# Patient Record
Sex: Male | Born: 1985 | Race: White | Hispanic: No | Marital: Married | Smoking: Never smoker
Health system: Southern US, Community
[De-identification: ages and names within clinical notes are randomized; demographics above are authoritative.]

## PROBLEM LIST (undated history)

## (undated) DIAGNOSIS — I371 Nonrheumatic pulmonary valve insufficiency: Secondary | ICD-10-CM

## (undated) DIAGNOSIS — F419 Anxiety disorder, unspecified: Secondary | ICD-10-CM

## (undated) DIAGNOSIS — I4510 Unspecified right bundle-branch block: Secondary | ICD-10-CM

## (undated) DIAGNOSIS — Q213 Tetralogy of Fallot: Secondary | ICD-10-CM

## (undated) HISTORY — DX: Tetralogy of Fallot: Q21.3

## (undated) HISTORY — DX: Unspecified right bundle-branch block: I45.10

## (undated) HISTORY — PX: TETRALOGY OF FALLOT REPAIR: SHX796

## (undated) HISTORY — DX: Nonrheumatic pulmonary valve insufficiency: I37.1

## (undated) HISTORY — DX: Anxiety disorder, unspecified: F41.9

---
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
## 2011-05-25 ENCOUNTER — Ambulatory Visit: Payer: Self-pay | Admitting: Internal Medicine

## 2011-07-28 ENCOUNTER — Encounter: Payer: Self-pay | Admitting: Internal Medicine

## 2011-08-23 ENCOUNTER — Emergency Department: Payer: Self-pay | Admitting: *Deleted

## 2011-08-23 ENCOUNTER — Telehealth: Payer: Self-pay | Admitting: Internal Medicine

## 2011-08-23 NOTE — Telephone Encounter (Signed)
Patient's grandmother called needing her grandson to be seen . Patient was on the job having headaches and loosing his sight. Dr. Dan Humphreys was out for the day and Dr. Darrick Huntsman was out of the office and she did not have an available slot for the patient to be seen . She was instructed to tell him to go to Urgent Care.

## 2011-09-20 HISTORY — PX: PULMONARY VALVE REPLACEMENT: SHX173

## 2011-10-21 ENCOUNTER — Telehealth: Payer: Self-pay | Admitting: *Deleted

## 2011-10-21 ENCOUNTER — Emergency Department: Payer: Self-pay | Admitting: Emergency Medicine

## 2011-10-21 LAB — BASIC METABOLIC PANEL
BUN: 13 mg/dL (ref 7–18)
Calcium, Total: 9.2 mg/dL (ref 8.5–10.1)
EGFR (African American): >60
EGFR (Non-African Amer.): >60
Osmolality: 281 (ref 275–301)
Sodium: 140 mmol/L (ref 136–145)

## 2011-10-21 LAB — CBC
HCT: 46.7 % (ref 40.0–52.0)
HGB: 16.2 g/dL (ref 13.0–18.0)
MCH: 31.7 pg (ref 26.0–34.0)
MCHC: 34.7 g/dL (ref 32.0–36.0)

## 2011-10-21 LAB — TROPONIN I: Troponin-I: <0.02 ng/mL

## 2011-10-21 NOTE — Telephone Encounter (Signed)
Agree with plan. Pt has h/o congential heart disease.  Will need emergent evaluation if chest pain.

## 2011-10-21 NOTE — Telephone Encounter (Signed)
I spoke w/pt's grandmother - She reports that pt is c/o left arm pain, CP, unable to hear from one ear (x 3 days). I advised ER, she agreed and will take pt to closest ER. MD informed and agreed

## 2012-01-23 ENCOUNTER — Ambulatory Visit (INDEPENDENT_AMBULATORY_CARE_PROVIDER_SITE_OTHER): Payer: BC Managed Care – PPO | Admitting: Internal Medicine

## 2012-01-23 ENCOUNTER — Encounter: Payer: Self-pay | Admitting: Internal Medicine

## 2012-01-23 VITALS — BP 102/71 | HR 85 | Temp 98.3°F | Resp 14 | Wt 122.5 lb

## 2012-01-23 DIAGNOSIS — R5381 Other malaise: Secondary | ICD-10-CM

## 2012-01-23 DIAGNOSIS — R5383 Other fatigue: Secondary | ICD-10-CM | POA: Insufficient documentation

## 2012-01-23 DIAGNOSIS — F411 Generalized anxiety disorder: Secondary | ICD-10-CM

## 2012-01-23 DIAGNOSIS — F419 Anxiety disorder, unspecified: Secondary | ICD-10-CM | POA: Insufficient documentation

## 2012-01-23 MED ORDER — BUPROPION HCL ER (XL) 150 MG PO TB24: 150.0000 mg | ORAL_TABLET | Freq: Every day | ORAL | Status: DC

## 2012-01-23 NOTE — Assessment & Plan Note (Signed)
Likely related to recent increased anxiety/depression. Given recent surgery, will check CBC, CMP with labs. Follow up 1 month.

## 2012-01-23 NOTE — Assessment & Plan Note (Signed)
Likely related to recent stressors including marriage and cardiothoracic surgery. Recommended counseling, but pt declined. Will start Wellbutrin 150mg  daily to help with symptoms. Follow up 1 month.

## 2012-01-23 NOTE — Progress Notes (Signed)
Subjective:    Patient ID: William Pham, male    DOB: [birth year], 26 y.o.   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 He notes that he had his valve replacement surgery in February 2013. After 6 weeks of recovery, he return to work at his job at [employer]. He reports that things are going well until a couple of days ago when he suddenly found himself unable to function. He denies any physical symptoms such as chest pain palpitations, or shortness of breath. He reports that his energy level had been pretty good until this weekend. He denies any ongoing issues at work or with friends. He also recently got married, and reports that is going well. He acknowledges some anxiety and depressed mood. He has never taken medication for this. He is not interested in counseling. He has strong support from his family. He has not yet been able to return to work because of anxiety.  Outpatient Encounter Prescriptions as of 01/23/2012  Medication Sig Dispense Refill  . aspirin 81 MG tablet Take 81 mg by mouth daily.      Marland Kitchen buPROPion (WELLBUTRIN XL) 150 MG 24 hr tablet Take 1 tablet (150 mg total) by mouth daily.  30 tablet  3    Review of Systems  Constitutional: Negative for fever, chills, activity change, appetite change, fatigue and unexpected weight change.  Eyes: Negative for visual disturbance.  Respiratory: Negative for cough and shortness of breath.   Cardiovascular: Negative for chest pain, palpitations and leg swelling.  Gastrointestinal: Negative for abdominal pain and abdominal distention.  Genitourinary: Negative for dysuria, urgency and difficulty urinating.  Musculoskeletal: Negative for arthralgias and gait problem.  Skin: Negative for color change and rash.  Hematological: Negative for adenopathy.  Psychiatric/Behavioral: Positive for  behavioral problems, dysphoric mood and decreased concentration. Negative for sleep disturbance. The patient is nervous/anxious.    BP 102/71  Pulse 85  Temp(Src) 98.3 F (36.8 C) (Oral)  Resp 14  Wt 122 lb 8 oz (55.566 kg)  SpO2 98%     Objective:   Physical Exam  Constitutional: He is oriented to person, place, and time. He appears well-developed and well-nourished. No distress.  HENT:  Head: Normocephalic and atraumatic.  Right Ear: External ear normal.  Left Ear: External ear normal.  Nose: Nose normal.  Mouth/Throat: Oropharynx is clear and moist. No oropharyngeal exudate.  Eyes: Conjunctivae and EOM are normal. Pupils are equal, round, and reactive to light. Right eye exhibits no discharge. Left eye exhibits no discharge. No scleral icterus.  Neck: Normal range of motion. Neck supple. No tracheal deviation present. No thyromegaly present.  Cardiovascular: Normal rate and regular rhythm.  Exam reveals no gallop and no friction rub.   Murmur heard. Pulmonary/Chest: Effort normal and breath sounds normal. No respiratory distress. He has no wheezes. He has no rales. He exhibits no tenderness.  Musculoskeletal: Normal range of motion. He exhibits no edema.  Lymphadenopathy:    He has no cervical adenopathy.  Neurological: He is alert and oriented to person, place, and time. No cranial nerve deficit. Coordination normal.  Skin: Skin is warm and dry. No rash noted. He is not diaphoretic. No erythema. No pallor.  Psychiatric: He has a normal mood and affect. His behavior is normal. Judgment and thought content normal.          Assessment & Plan:

## 2012-01-24 LAB — CBC WITH DIFFERENTIAL/PLATELET
Basophils Relative: 0.7 % (ref 0.0–3.0)
Eosinophils Absolute: 0.4 10*3/uL (ref 0.0–0.7)
Eosinophils Relative: 4.4 % (ref 0.0–5.0)
Hemoglobin: 15.5 g/dL (ref 13.0–17.0)
MCHC: 33.5 g/dL (ref 30.0–36.0)
MCV: 87.1 fl (ref 78.0–100.0)
Monocytes Absolute: 0.8 10*3/uL (ref 0.1–1.0)
Neutro Abs: 5.7 10*3/uL (ref 1.4–7.7)
RBC: 5.31 Mil/uL (ref 4.22–5.81)
WBC: 9.1 10*3/uL (ref 4.5–10.5)

## 2012-01-24 LAB — COMPREHENSIVE METABOLIC PANEL
AST: 24 U/L (ref 0–37)
Alkaline Phosphatase: 71 U/L (ref 39–117)
BUN: 16 mg/dL (ref 6–23)
Creatinine, Ser: 0.8 mg/dL (ref 0.4–1.5)
Glucose, Bld: 80 mg/dL (ref 70–99)
Total Bilirubin: 0.3 mg/dL (ref 0.3–1.2)

## 2012-01-25 ENCOUNTER — Encounter: Payer: Self-pay | Admitting: Internal Medicine

## 2012-05-07 ENCOUNTER — Telehealth: Payer: Self-pay | Admitting: Internal Medicine

## 2012-05-07 NOTE — Telephone Encounter (Signed)
Caller: Hilda/Grandparent; Patient Name: William Pham; PCP: Ronna Polio; Best Callback Phone Number: [phone].  Reports increasing behavior changes with increased fatigue, sleeps all the time, not eating well, recently unable to leave car to go into work.  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 Advised to see MD within 24 hours for recurring or worsening symptoms and history of depression per Depression Guideline.  Dr Dan Humphreys has no more appointments for 05/07/12 or 05/08/12.  Appointment scheduled for only remaining appointment 05/08/12 at 1130 with Dr. Darrick Huntsman.

## 2012-05-08 ENCOUNTER — Ambulatory Visit (INDEPENDENT_AMBULATORY_CARE_PROVIDER_SITE_OTHER): Payer: BC Managed Care – PPO | Admitting: Internal Medicine

## 2012-05-08 ENCOUNTER — Encounter: Payer: Self-pay | Admitting: Internal Medicine

## 2012-05-08 VITALS — BP 120/78 | HR 70 | Temp 98.0°F | Resp 16 | Wt 119.5 lb

## 2012-05-08 DIAGNOSIS — F411 Generalized anxiety disorder: Secondary | ICD-10-CM

## 2012-05-08 DIAGNOSIS — F419 Anxiety disorder, unspecified: Secondary | ICD-10-CM

## 2012-05-08 MED ORDER — BUPROPION HCL ER (XL) 150 MG PO TB24: 300.0000 mg | ORAL_TABLET | Freq: Every day | ORAL | Status: DC

## 2012-05-08 MED ORDER — ALPRAZOLAM 0.25 MG PO TABS: 0.2500 mg | ORAL_TABLET | Freq: Two times a day (BID) | ORAL | Status: DC | PRN

## 2012-05-08 NOTE — Progress Notes (Signed)
Patient ID: William Pham, male   DOB: 02-05-1986, 26 y.o.   MRN: [id number]  Patient Active Problem List  Diagnosis  . Fatigue  . Anxiety    Subjective:  CC:   Chief Complaint  Patient presents with  . Fatigue  . Anorexia  . Stress    HPI:   William Pham a 26 y.o. male who presents Follow up on anxiety. Tolerating the wellbutrin once daily since May with no side effects, but over the last several weeks his anxiety has again become uncontrolled. He is having decreased sleep due to difficulty with sleep initiation. He has laid awake as long as 5 hours before falling asleep. He is having increased anxiety at work.   Past Medical History  Diagnosis Date  . Congenital heart disease     Dr. Earma Reading    Past Surgical History  Procedure Date  . Pulmonary valve replacement 2013    Dr. Phineas Inches, [facility]         The following portions of the patient's history were reviewed and updated as appropriate: Allergies, current medications, and problem list.    Review of Systems:   A comprehensive ROS was done and positive for anxiety and insomnia.   The rest was negative.   History   Social History  . Marital Status: Single    Spouse Name: N/A    Number of Children: N/A  . Years of Education: N/A   Occupational History  . Not on file.   Social History Main Topics  . Smoking status: Never Smoker   . Smokeless tobacco: Never Used  . Alcohol Use: No  . Drug Use: No  . Sexually Active: Not on file   Other Topics Concern  . Not on file   Social History Narrative   Lives with wife in [place]. Recently married. Works at [employer].    Objective:  BP 120/78  Pulse 70  Temp 98 F (36.7 C) (Oral)  Resp 16  Wt 119 lb 8 oz (54.205 kg)  SpO2 97%  General appearance: alert, cooperative and appears stated age Ears: normal TM's and external ear canals both ears Throat: lips, mucosa, and tongue normal; teeth and gums normal Neck: no adenopathy, no  carotid bruit, supple, symmetrical, trachea midline and thyroid not enlarged, symmetric, no tenderness/mass/nodules Back: symmetric, no curvature. ROM normal. No CVA tenderness. Lungs: clear to auscultation bilaterally Heart: regular rate and rhythm, S1, S2 normal, no murmur, click, rub or gallop Abdomen: soft, non-tender; bowel sounds normal; no masses,  no organomegaly Pulses: 2+ and symmetric Skin: Skin color, texture, turgor normal. No rashes or lesions Lymph nodes: Cervical, supraclavicular, and axillary nodes normal.  Assessment and Plan:  Anxiety Stressors include recent cardiac surgery requiring pulmonary valve replacement Since he had good relief with initial dose of Wellbutrin started by Dr. walker we discussed titrating this dose and adding alprazolam for when necessary panicky feelings. I discussed with him and his wife that choosing the right medication for management of anxiety takes followup and is a bit of trial and error. He needs to he needs to follow up with Dr. walker in 2 weeks to be sure that the medication is appropriate for him long-term.   Updated Medication List Outpatient Encounter Prescriptions as of 05/08/2012  Medication Sig Dispense Refill  . aspirin 81 MG tablet Take 81 mg by mouth daily.      Marland Kitchen buPROPion (WELLBUTRIN XL) 150 MG 24 hr tablet Take 2 tablets (300 mg total) by  mouth daily.  30 tablet  3  . DISCONTD: buPROPion (WELLBUTRIN XL) 150 MG 24 hr tablet Take 1 tablet (150 mg total) by mouth daily.  30 tablet  3  . DISCONTD: buPROPion (WELLBUTRIN XL) 150 MG 24 hr tablet Take 2 tablets (300 mg total) by mouth daily.  30 tablet  3  . ALPRAZolam (XANAX) 0.25 MG tablet Take 1 tablet (0.25 mg total) by mouth 2 (two) times daily as needed for sleep or anxiety.  60 tablet  1     No orders of the defined types were placed in this encounter.    Return in about 1 month (around 06/08/2012).

## 2012-05-08 NOTE — Patient Instructions (Addendum)
I am increasing your wellbutrin to 300 mg daily , starting today.  i am adding a very low dose of alprazolam to take once or twice daily as needed for insomnia and anxiety   Please return to see Dr Dan Humphreys in one month,  Sooner if you  Are having no relief

## 2012-05-09 ENCOUNTER — Encounter: Payer: Self-pay | Admitting: Internal Medicine

## 2012-05-09 NOTE — Assessment & Plan Note (Addendum)
Stressors include recent cardiac surgery requiring pulmonary valve replacement Since he had good relief with initial dose of Wellbutrin started by Dr. walker we discussed titrating this dose and adding alprazolam for when necessary panicky feelings. I discussed with him and his wife that choosing the right medication for management of anxiety takes followup and is a bit of trial and error. He needs to he needs to follow up with Dr. walker in 2 weeks to be sure that the medication is appropriate for him long-term.

## 2012-06-08 ENCOUNTER — Ambulatory Visit (INDEPENDENT_AMBULATORY_CARE_PROVIDER_SITE_OTHER): Payer: BC Managed Care – PPO | Admitting: Internal Medicine

## 2012-06-08 ENCOUNTER — Encounter: Payer: Self-pay | Admitting: Internal Medicine

## 2012-06-08 VITALS — BP 120/80 | HR 72 | Temp 98.4°F | Ht 64.0 in | Wt 124.0 lb

## 2012-06-08 DIAGNOSIS — F419 Anxiety disorder, unspecified: Secondary | ICD-10-CM

## 2012-06-08 DIAGNOSIS — F411 Generalized anxiety disorder: Secondary | ICD-10-CM

## 2012-06-08 MED ORDER — CLONAZEPAM 0.5 MG PO TABS: 0.5000 mg | ORAL_TABLET | Freq: Three times a day (TID) | ORAL | Status: DC | PRN

## 2012-06-08 MED ORDER — BUPROPION HCL ER (XL) 300 MG PO TB24: 300.0000 mg | ORAL_TABLET | Freq: Every day | ORAL | Status: DC

## 2012-06-08 NOTE — Assessment & Plan Note (Signed)
Improvement noted with use of Wellbutrin 300mg  daily, however still having some panic attacks particularly at work. Will try changing xanax to Clonazepam to see if improvement in control of panic episodes. Follow up 1 month. Encouraged pt to call or email sooner if symptoms not improving.

## 2012-06-08 NOTE — Progress Notes (Signed)
  Subjective:    Patient ID: William Pham, male    DOB: 1986/07/09, 26 y.o.   MRN: [id number]  HPI 26 year old male with history of anxiety presents for followup. He reports some improvement since increasing his dose of Wellbutrin to 300 mg daily. However, he continues to have panic attacks particularly at work and when driving. He has been taking Xanax to help with this with minimal improvement. He notes some sedation with use of Xanax.  Outpatient Encounter Prescriptions as of 06/08/2012  Medication Sig Dispense Refill  . aspirin 81 MG tablet Take 81 mg by mouth daily.      Marland Kitchen buPROPion (WELLBUTRIN XL) 300 MG 24 hr tablet Take 1 tablet (300 mg total) by mouth daily.  30 tablet  3  . DISCONTD: ALPRAZolam (XANAX) 0.25 MG tablet Take 1 tablet (0.25 mg total) by mouth 2 (two) times daily as needed for sleep or anxiety.  60 tablet  1  . DISCONTD: buPROPion (WELLBUTRIN XL) 150 MG 24 hr tablet Take 2 tablets (300 mg total) by mouth daily.  30 tablet  3  . clonazePAM (KLONOPIN) 0.5 MG tablet Take 1 tablet (0.5 mg total) by mouth 3 (three) times daily as needed for anxiety.  90 tablet  1   BP 120/80  Pulse 72  Temp 98.4 F (36.9 C) (Oral)  Ht 5\' 4"  (1.626 m)  Wt 124 lb (56.246 kg)  BMI 21.28 kg/m2  SpO2 95%  Review of Systems  Constitutional: Negative for fever, chills, activity change, appetite change, fatigue and unexpected weight change.  Eyes: Negative for visual disturbance.  Respiratory: Negative for cough and shortness of breath.   Cardiovascular: Negative for chest pain, palpitations and leg swelling.  Gastrointestinal: Negative for abdominal pain and abdominal distention.  Genitourinary: Negative for dysuria, urgency and difficulty urinating.  Musculoskeletal: Negative for arthralgias and gait problem.  Skin: Negative for color change and rash.  Hematological: Negative for adenopathy.  Psychiatric/Behavioral: Negative for disturbed wake/sleep cycle and dysphoric mood. The  patient is nervous/anxious.        Objective:   Physical Exam  Constitutional: He is oriented to person, place, and time. He appears well-developed and well-nourished. No distress.  HENT:  Head: Normocephalic and atraumatic.  Right Ear: External ear normal.  Left Ear: External ear normal.  Nose: Nose normal.  Mouth/Throat: Oropharynx is clear and moist. No oropharyngeal exudate.  Eyes: Conjunctivae normal and EOM are normal. Pupils are equal, round, and reactive to light. Right eye exhibits no discharge. Left eye exhibits no discharge. No scleral icterus.  Neck: Normal range of motion. Neck supple. No tracheal deviation present. No thyromegaly present.  Cardiovascular: Normal rate, regular rhythm and normal heart sounds.  Exam reveals no gallop and no friction rub.   No murmur heard. Pulmonary/Chest: Effort normal and breath sounds normal. No respiratory distress. He has no wheezes. He has no rales. He exhibits no tenderness.  Musculoskeletal: Normal range of motion. He exhibits no edema.  Lymphadenopathy:    He has no cervical adenopathy.  Neurological: He is alert and oriented to person, place, and time. No cranial nerve deficit. Coordination normal.  Skin: Skin is warm and dry. No rash noted. He is not diaphoretic. No erythema. No pallor.  Psychiatric: Judgment and thought content normal. His mood appears anxious. He is withdrawn.          Assessment & Plan:

## 2012-07-09 ENCOUNTER — Encounter: Payer: Self-pay | Admitting: Internal Medicine

## 2012-07-09 ENCOUNTER — Ambulatory Visit (INDEPENDENT_AMBULATORY_CARE_PROVIDER_SITE_OTHER): Payer: BC Managed Care – PPO | Admitting: Internal Medicine

## 2012-07-09 VITALS — BP 118/80 | HR 66 | Temp 98.0°F | Ht 64.0 in | Wt 122.5 lb

## 2012-07-09 DIAGNOSIS — F419 Anxiety disorder, unspecified: Secondary | ICD-10-CM

## 2012-07-09 DIAGNOSIS — F411 Generalized anxiety disorder: Secondary | ICD-10-CM

## 2012-07-09 MED ORDER — BUPROPION HCL ER (XL) 300 MG PO TB24: 300.0000 mg | ORAL_TABLET | Freq: Every day | ORAL | Status: DC

## 2012-07-09 NOTE — Assessment & Plan Note (Signed)
Symptoms much improved on higher dose of Wellbutrin. Will continue. Followup in 6 months or sooner as needed.

## 2012-07-09 NOTE — Progress Notes (Signed)
  Subjective:    Patient ID: William Pham, male    DOB: 31-Jan-1986, 26 y.o.   MRN: [id number]  HPI 26 year old male presents for followup of anxiety. He reports significant improvement in anxiety and panic attacks ever since starting on higher dose of Wellbutrin. He no longer has required the use of Clonopin to help with panic attacks. He denies any noted side effects from medication. He denies any new concerns today.  Outpatient Encounter Prescriptions as of 07/09/2012  Medication Sig Dispense Refill  . aspirin 81 MG tablet Take 81 mg by mouth daily.      Marland Kitchen buPROPion (WELLBUTRIN XL) 300 MG 24 hr tablet Take 1 tablet (300 mg total) by mouth daily.  30 tablet  6  . clonazePAM (KLONOPIN) 0.5 MG tablet Take 1 tablet (0.5 mg total) by mouth 3 (three) times daily as needed for anxiety.  90 tablet  1  . DISCONTD: buPROPion (WELLBUTRIN XL) 300 MG 24 hr tablet Take 1 tablet (300 mg total) by mouth daily.  30 tablet  3   BP 118/80  Pulse 66  Temp 98 F (36.7 C) (Oral)  Ht 5\' 4"  (1.626 m)  Wt 122 lb 8 oz (55.566 kg)  BMI 21.03 kg/m2  SpO2 97%  Review of Systems  Constitutional: Negative for fever, chills, activity change, appetite change, fatigue and unexpected weight change.  Eyes: Negative for visual disturbance.  Respiratory: Negative for cough and shortness of breath.   Cardiovascular: Negative for chest pain, palpitations and leg swelling.  Gastrointestinal: Negative for abdominal pain and abdominal distention.  Genitourinary: Negative for dysuria, urgency and difficulty urinating.  Musculoskeletal: Negative for arthralgias and gait problem.  Skin: Negative for color change and rash.  Hematological: Negative for adenopathy.  Psychiatric/Behavioral: Negative for disturbed wake/sleep cycle and dysphoric mood. The patient is nervous/anxious.        Objective:   Physical Exam  Constitutional: He is oriented to person, place, and time. He appears well-developed and well-nourished.  No distress.  HENT:  Head: Normocephalic and atraumatic.  Right Ear: External ear normal.  Left Ear: External ear normal.  Nose: Nose normal.  Mouth/Throat: Oropharynx is clear and moist. No oropharyngeal exudate.  Eyes: Conjunctivae normal and EOM are normal. Pupils are equal, round, and reactive to light. Right eye exhibits no discharge. Left eye exhibits no discharge. No scleral icterus.  Neck: Normal range of motion. Neck supple. No tracheal deviation present. No thyromegaly present.  Cardiovascular: Normal rate and regular rhythm.  Exam reveals no gallop and no friction rub.   Murmur heard. Pulmonary/Chest: Effort normal and breath sounds normal. No respiratory distress. He has no wheezes. He has no rales. He exhibits no tenderness.  Musculoskeletal: Normal range of motion. He exhibits no edema.  Lymphadenopathy:    He has no cervical adenopathy.  Neurological: He is alert and oriented to person, place, and time. No cranial nerve deficit. Coordination normal.  Skin: Skin is warm and dry. No rash noted. He is not diaphoretic. No erythema. No pallor.  Psychiatric: He has a normal mood and affect. His behavior is normal. Judgment and thought content normal.          Assessment & Plan:

## 2012-09-03 ENCOUNTER — Other Ambulatory Visit: Payer: Self-pay | Admitting: Internal Medicine

## 2012-09-04 ENCOUNTER — Other Ambulatory Visit: Payer: Self-pay | Admitting: Internal Medicine

## 2012-09-04 ENCOUNTER — Other Ambulatory Visit: Payer: Self-pay

## 2012-09-04 DIAGNOSIS — F419 Anxiety disorder, unspecified: Secondary | ICD-10-CM

## 2012-09-04 NOTE — Telephone Encounter (Signed)
° °  Drug Name- Bupropion HCL 150 mg XL tab  Directions- Take two tablets by mouth every day  Quantity- 60

## 2012-09-05 ENCOUNTER — Other Ambulatory Visit: Payer: Self-pay | Admitting: Internal Medicine

## 2012-09-05 DIAGNOSIS — F419 Anxiety disorder, unspecified: Secondary | ICD-10-CM

## 2012-09-05 MED ORDER — BUPROPION HCL ER (XL) 300 MG PO TB24: 300.0000 mg | ORAL_TABLET | Freq: Every day | ORAL | Status: DC

## 2012-09-05 NOTE — Telephone Encounter (Signed)
Pt is needing refill on Wellbutrin he uses [facility]

## 2012-09-05 NOTE — Telephone Encounter (Signed)
Med filled.  

## 2012-11-21 ENCOUNTER — Other Ambulatory Visit: Payer: Self-pay | Admitting: Internal Medicine

## 2012-11-21 NOTE — Telephone Encounter (Signed)
Received request refill. Medication is not on medication sheet. Is it okay to refill medication?

## 2012-11-22 NOTE — Telephone Encounter (Signed)
Rx has been printed, signed and faxed to pharmacy on file

## 2012-12-28 ENCOUNTER — Ambulatory Visit (INDEPENDENT_AMBULATORY_CARE_PROVIDER_SITE_OTHER): Payer: BC Managed Care – PPO | Admitting: Adult Health

## 2012-12-28 ENCOUNTER — Emergency Department: Payer: Self-pay | Admitting: Emergency Medicine

## 2012-12-28 ENCOUNTER — Encounter: Payer: Self-pay | Admitting: Adult Health

## 2012-12-28 VITALS — BP 100/80 | HR 84 | Temp 98.9°F | Resp 14 | Wt 119.5 lb

## 2012-12-28 DIAGNOSIS — R6889 Other general symptoms and signs: Secondary | ICD-10-CM

## 2012-12-28 DIAGNOSIS — R509 Fever, unspecified: Secondary | ICD-10-CM | POA: Insufficient documentation

## 2012-12-28 DIAGNOSIS — J111 Influenza due to unidentified influenza virus with other respiratory manifestations: Secondary | ICD-10-CM

## 2012-12-28 LAB — CBC
HCT: 42.5 % (ref 40.0–52.0)
MCH: 29.6 pg (ref 26.0–34.0)
MCHC: 33.6 g/dL (ref 32.0–36.0)
Platelet: 164 10*3/uL (ref 150–440)
RBC: 4.82 10*6/uL (ref 4.40–5.90)
RDW: 13.6 % (ref 11.5–14.5)
WBC: 5.3 10*3/uL (ref 3.8–10.6)

## 2012-12-28 LAB — URINALYSIS, COMPLETE
Bacteria: NONE SEEN
Bilirubin,UR: NEGATIVE
Blood: NEGATIVE
Glucose,UR: NEGATIVE mg/dL (ref 0–75)
Specific Gravity: 1.02 (ref 1.003–1.030)

## 2012-12-28 LAB — COMPREHENSIVE METABOLIC PANEL
Alkaline Phosphatase: 82 U/L (ref 50–136)
Anion Gap: 5 — ABNORMAL LOW (ref 7–16)
BUN: 13 mg/dL (ref 7–18)
Bilirubin,Total: 0.5 mg/dL (ref 0.2–1.0)
Calcium, Total: 7.7 mg/dL — ABNORMAL LOW (ref 8.5–10.1)
Co2: 29 mmol/L (ref 21–32)
EGFR (African American): >60
Osmolality: 277 (ref 275–301)
SGOT(AST): 32 U/L (ref 15–37)

## 2012-12-28 LAB — POCT INFLUENZA A/B: Influenza A, POC: NEGATIVE

## 2012-12-28 LAB — CK TOTAL AND CKMB (NOT AT ARMC): CK-MB: 0.5 ng/mL (ref 0.5–3.6)

## 2012-12-28 LAB — TROPONIN I: Troponin-I: <0.02 ng/mL

## 2012-12-28 NOTE — Progress Notes (Signed)
  Subjective:    Patient ID: William Pham, male    DOB: 1985-11-12, 27 y.o.   MRN: [id number]  HPI  Patient is a 27 y/o male who presents to clinic with fever, body aches, HA. Symptoms began on Wednesday. He denies any upper respiratory symptoms. He denies tick bites or being outdoors. No abdominal pain or discomfort. He has not been exposed to any sick contacts that he is aware of. However, patient works at [employer] with the [employer].  Current Outpatient Prescriptions on File Prior to Visit  Medication Sig Dispense Refill  . ALPRAZolam (XANAX) 0.25 MG tablet TAKE ONE TABLET BY MOUTH TWICE DAILY AS NEEDED  60 tablet  0  . aspirin 81 MG tablet Take 81 mg by mouth daily.      Marland Kitchen buPROPion (WELLBUTRIN XL) 300 MG 24 hr tablet Take 1 tablet (300 mg total) by mouth daily.  30 tablet  3  . clonazePAM (KLONOPIN) 0.5 MG tablet Take 1 tablet (0.5 mg total) by mouth 3 (three) times daily as needed for anxiety.  90 tablet  1   No current facility-administered medications on file prior to visit.    Review of Systems  Constitutional: Positive for fever, chills and fatigue.  Eyes: Positive for photophobia.  Respiratory: Negative for cough, chest tightness, shortness of breath and wheezing.   Cardiovascular: Negative for chest pain.  Gastrointestinal: Negative for nausea, vomiting, abdominal pain, diarrhea and constipation.  Genitourinary: Negative for dysuria, hematuria, flank pain and difficulty urinating.  Neurological: Positive for dizziness, light-headedness and headaches. Negative for syncope.  Psychiatric/Behavioral: Negative for confusion and agitation. The patient is not nervous/anxious.      BP 100/80  Pulse 84  Temp(Src) 98.9 F (37.2 C) (Oral)  Resp 14  Wt 119 lb 8 oz (54.205 kg)  BMI 20.5 kg/m2  SpO2 97%     Objective:   Physical Exam  Constitutional: He is oriented to person, place, and time.  Apparently not feeling well.  HENT:  Head: Normocephalic and  atraumatic.  Cardiovascular: Normal rate, regular rhythm and normal heart sounds.   Pulmonary/Chest: Effort normal and breath sounds normal. No respiratory distress. He has no wheezes. He has no rales.  Lymphadenopathy:    He has no cervical adenopathy.  Neurological: He is alert and oriented to person, place, and time.  Skin: Skin is warm and dry.  Psychiatric: His behavior is normal.  Flat affect          Assessment & Plan:

## 2012-12-28 NOTE — Assessment & Plan Note (Signed)
Patient with history of congenital heart disease status post valve replacement who presents with fever of unknown origin. We'll check labs as follows CBC with differential, cmet, blood cultures, urinalysis. I will start him on doxycycline 100 mg twice a day x10 days. During blood draws, patient became pale, diaphoretic and had near syncopal episode. EMS was called and he was transported to the emergency room at [facility].

## 2013-01-03 LAB — CULTURE, BLOOD (SINGLE)

## 2013-01-09 ENCOUNTER — Ambulatory Visit: Payer: BC Managed Care – PPO | Admitting: Internal Medicine

## 2013-01-10 ENCOUNTER — Telehealth: Payer: Self-pay | Admitting: Internal Medicine

## 2013-01-10 NOTE — Telephone Encounter (Signed)
Pt came in on wrong date for appointment reschedule to 5/1  Spouse wanted to know if you could prescribe him something for drainage. Pt is starting to cough and congestion in chest and head [facility]

## 2013-01-10 NOTE — Telephone Encounter (Signed)
Please read below...

## 2013-01-10 NOTE — Telephone Encounter (Signed)
He will need to be seen prior to Rx. I would recommend non-sedating antihistamines such as Claritin.

## 2013-01-10 NOTE — Telephone Encounter (Signed)
Informed patient wife and she verbally agreed.

## 2013-01-17 ENCOUNTER — Encounter: Payer: Self-pay | Admitting: Internal Medicine

## 2013-01-17 ENCOUNTER — Ambulatory Visit (INDEPENDENT_AMBULATORY_CARE_PROVIDER_SITE_OTHER): Payer: BC Managed Care – PPO | Admitting: Internal Medicine

## 2013-01-17 VITALS — BP 108/70 | HR 80 | Temp 98.4°F | Wt 124.0 lb

## 2013-01-17 DIAGNOSIS — Z8774 Personal history of (corrected) congenital malformations of heart and circulatory system: Secondary | ICD-10-CM

## 2013-01-17 DIAGNOSIS — F411 Generalized anxiety disorder: Secondary | ICD-10-CM

## 2013-01-17 DIAGNOSIS — F419 Anxiety disorder, unspecified: Secondary | ICD-10-CM

## 2013-01-17 DIAGNOSIS — Z9889 Other specified postprocedural states: Secondary | ICD-10-CM

## 2013-01-17 NOTE — Assessment & Plan Note (Signed)
Symptoms of anxiety are improved. Patient has stopped medication. Will continue to monitor for now. Patient will call if any recurrent symptoms or concerns.

## 2013-01-17 NOTE — Assessment & Plan Note (Signed)
Reviewed recent records from [facility] with patient. Continue with scheduled followup.

## 2013-01-17 NOTE — Progress Notes (Signed)
  Subjective:    Patient ID: William Pham, male    DOB: 1986/07/21, 27 y.o.   MRN: [id number]  HPI 27 year old male with history of tetralogy of Fallot status post repair, anxiety presents for followup. He reports that symptoms of anxiety have much improved. He stopped taking his alprazolam, Clonopin, and Wellbutrin. He denies any anxious mood. His situation at work has improved. He denies any new concerns today.Denies any recent chest pain, palpitations, dyspnea. Continues on Aspirin per cardiology recommendations.  Outpatient Encounter Prescriptions as of 01/17/2013  Medication Sig Dispense Refill  . aspirin 81 MG tablet Take 81 mg by mouth daily.      . [DISCONTINUED] ALPRAZolam (XANAX) 0.25 MG tablet TAKE ONE TABLET BY MOUTH TWICE DAILY AS NEEDED  60 tablet  0  . [DISCONTINUED] buPROPion (WELLBUTRIN XL) 300 MG 24 hr tablet Take 1 tablet (300 mg total) by mouth daily.  30 tablet  3  . [DISCONTINUED] clonazePAM (KLONOPIN) 0.5 MG tablet Take 1 tablet (0.5 mg total) by mouth 3 (three) times daily as needed for anxiety.  90 tablet  1   No facility-administered encounter medications on file as of 01/17/2013.   BP 108/70  Pulse 80  Temp(Src) 98.4 F (36.9 C) (Oral)  Wt 124 lb (56.246 kg)  BMI 21.27 kg/m2  SpO2 97%  Review of Systems  Constitutional: Negative for fever, chills, activity change, appetite change, fatigue and unexpected weight change.  Eyes: Negative for visual disturbance.  Respiratory: Negative for cough and shortness of breath.   Cardiovascular: Negative for chest pain, palpitations and leg swelling.  Gastrointestinal: Negative for abdominal pain and abdominal distention.  Genitourinary: Negative for dysuria, urgency and difficulty urinating.  Musculoskeletal: Negative for arthralgias and gait problem.  Skin: Negative for color change and rash.  Hematological: Negative for adenopathy.  Psychiatric/Behavioral: Negative for sleep disturbance and dysphoric mood. The  patient is not nervous/anxious.        Objective:   Physical Exam  Constitutional: He is oriented to person, place, and time. He appears well-developed and well-nourished. No distress.  HENT:  Head: Normocephalic and atraumatic.  Right Ear: External ear normal.  Left Ear: External ear normal.  Nose: Nose normal.  Mouth/Throat: Oropharynx is clear and moist. No oropharyngeal exudate.  Eyes: Conjunctivae and EOM are normal. Pupils are equal, round, and reactive to light. Right eye exhibits no discharge. Left eye exhibits no discharge. No scleral icterus.  Neck: Normal range of motion. Neck supple. No tracheal deviation present. No thyromegaly present.  Cardiovascular: Normal rate, regular rhythm and normal heart sounds.  Exam reveals no gallop and no friction rub.   No murmur heard. Pulmonary/Chest: Effort normal and breath sounds normal. No accessory muscle usage. Not tachypneic. No respiratory distress. He has no decreased breath sounds. He has no wheezes. He has no rhonchi. He has no rales. He exhibits no tenderness.  Musculoskeletal: Normal range of motion. He exhibits no edema.  Lymphadenopathy:    He has no cervical adenopathy.  Neurological: He is alert and oriented to person, place, and time. No cranial nerve deficit. Coordination normal.  Skin: Skin is warm and dry. No rash noted. He is not diaphoretic. No erythema. No pallor.  Psychiatric: He has a normal mood and affect. His behavior is normal. Judgment and thought content normal.          Assessment & Plan:

## 2013-01-18 ENCOUNTER — Telehealth: Payer: Self-pay | Admitting: Internal Medicine

## 2013-01-18 NOTE — Telephone Encounter (Signed)
LMTCB

## 2013-01-18 NOTE — Telephone Encounter (Signed)
Spouse dropped off disability paperwork and wanted to know if she could have them back today.  Needs to be faxed by 01/22/13 In box

## 2013-01-18 NOTE — Telephone Encounter (Signed)
Patient wife called back, stating patient need form to be filled out for [facility] since he missed so many days because of the migraines and dizziness he was having.

## 2013-01-18 NOTE — Telephone Encounter (Signed)
I will try to fill them out as soon as I can, but likely not today as I am seeing patients.

## 2013-01-21 DIAGNOSIS — Z0279 Encounter for issue of other medical certificate: Secondary | ICD-10-CM

## 2013-01-21 NOTE — Telephone Encounter (Signed)
Left message on wife voicemail informing her paperwork is not ready but as soon as it is completed we will give them a call.

## 2013-01-21 NOTE — Telephone Encounter (Signed)
Patient's dead line is tomorrow . Wife calling to see if paper work is ready.

## 2013-01-22 NOTE — Telephone Encounter (Signed)
Left message on wife voicemail forms was ready to be picked up, they were at the front desk.

## 2013-07-22 ENCOUNTER — Ambulatory Visit: Payer: BC Managed Care – PPO | Admitting: Internal Medicine

## 2015-08-18 ENCOUNTER — Ambulatory Visit (INDEPENDENT_AMBULATORY_CARE_PROVIDER_SITE_OTHER): Payer: Self-pay | Admitting: Cardiovascular Disease

## 2015-08-18 ENCOUNTER — Encounter: Payer: Self-pay | Admitting: Cardiovascular Disease

## 2015-08-18 ENCOUNTER — Encounter (INDEPENDENT_AMBULATORY_CARE_PROVIDER_SITE_OTHER): Payer: Self-pay

## 2015-08-18 VITALS — BP 108/68 | HR 70 | Ht 67.0 in | Wt 135.0 lb

## 2015-08-18 DIAGNOSIS — I379 Nonrheumatic pulmonary valve disorder, unspecified: Secondary | ICD-10-CM

## 2015-08-18 DIAGNOSIS — Z01810 Encounter for preprocedural cardiovascular examination: Secondary | ICD-10-CM

## 2015-08-18 DIAGNOSIS — Z9889 Other specified postprocedural states: Secondary | ICD-10-CM

## 2015-08-18 DIAGNOSIS — Z8774 Personal history of (corrected) congenital malformations of heart and circulatory system: Secondary | ICD-10-CM

## 2015-08-18 NOTE — Assessment & Plan Note (Signed)
The patient is doing very well overall from a cardiac standpoint and appears to be stable. He should always get endocarditis prophylaxis. I requested an echocardiogram as he has not had one in the recent years.

## 2015-08-18 NOTE — Assessment & Plan Note (Signed)
Status post repair with bioprosthetic valve. An echocardiogram was ordered.

## 2015-08-18 NOTE — Progress Notes (Signed)
   HPI  [name] is a [age] white male who is here today to establish cardiovascular care locally. He has had tetralogy of Fallot repair as an infant when he was 8910 months old at [facility]. 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  He has been doing well since then with no chest pain, shortness of breath or palpitations. He has follow-up with Dr. Earma ReadingBashore in the past but he lives in [place].  No Known Allergies   Current Outpatient Prescriptions on File Prior to Visit  Medication Sig Dispense Refill  . aspirin 81 MG tablet Take 81 mg by mouth daily.     No current facility-administered medications on file prior to visit.     Past Medical History  Diagnosis Date  . Congenital heart disease     Dr. Earma ReadingBashore     Past Surgical History  Procedure Laterality Date  . Pulmonary valve replacement  2013    Dr. Phineas InchesAndrew Lodge, [facility]     Family History  Problem Relation Age of Onset  . Hypertension Maternal Grandmother   . Hypertension Mother      Social History   Social History  . Marital Status: Single    Spouse Name: N/A  . Number of Children: N/A  . Years of Education: N/A   Occupational History  . Not on file.   Social History Main Topics  . Smoking status: Never Smoker   . Smokeless tobacco: Never Used  . Alcohol Use: No  . Drug Use: No  . Sexual Activity: Not on file   Other Topics Concern  . Not on file   Social History Narrative   Lives with wife in [place]. Recently married. Works at [employer].     ROS A 10 point review of system was performed. It is negative other than that mentioned in the history of present illness.   PHYSICAL EXAM   BP 108/68 mmHg  Pulse 70  Ht 5\' 7"  (1.702 m)  Wt 135 lb (61.236 kg)  BMI 21.14 kg/m2 Constitutional: He is oriented to person, place, and time. He appears well-developed and well-nourished. No distress.    HENT: No nasal discharge.  Head: Normocephalic and atraumatic.  Eyes: Pupils are equal and round.  No discharge. Neck: Normal range of motion. Neck supple. No JVD present. No thyromegaly present.  Cardiovascular: Normal rate, regular rhythm. Exam reveals no gallop and no friction rub. There is a 2/6 systolic ejection murmur in the pulmonic area and 1/6 crescendo murmur in the pulmonic area. There is possible fixed splitting of S2. Pulmonary/Chest: Effort normal and breath sounds normal. No stridor. No respiratory distress. He has no wheezes. He has no rales. He exhibits no tenderness.  Abdominal: Soft. Bowel sounds are normal. He exhibits no distension. There is no tenderness. There is no rebound and no guarding.  Musculoskeletal: Normal range of motion. He exhibits no edema and no tenderness.  Neurological: He is alert and oriented to person, place, and time. Coordination normal.  Skin: Skin is warm and dry. No rash noted. He is not diaphoretic. No erythema. No pallor.  Psychiatric: He has a normal mood and affect. His behavior is normal. Judgment and thought content normal.       EKG: Normal sinus rhythm with right bundle branch block.   ASSESSMENT AND PLAN

## 2015-08-18 NOTE — Patient Instructions (Signed)
Medication Instructions:  Your physician recommends that you continue on your current medications as directed. Please refer to the Current Medication list given to you today.   Labwork: none  Testing/Procedures: Your physician has requested that you have an echocardiogram. Echocardiography is a painless test that uses sound waves to create images of your heart. It provides your doctor with information about the size and shape of your heart and how well your heart's chambers and valves are working. This procedure takes approximately one hour. There are no restrictions for this procedure.    Follow-Up: Your physician wants you to follow-up in: one year with Dr. Arida.  You will receive a reminder letter in the mail two months in advance. If you don't receive a letter, please call our office to schedule the follow-up appointment.   Any Other Special Instructions Will Be Listed Below (If Applicable).     If you need a refill on your cardiac medications before your next appointment, please call your pharmacy.  Echocardiogram An echocardiogram, or echocardiography, uses sound waves (ultrasound) to produce an image of your heart. The echocardiogram is simple, painless, obtained within a short period of time, and offers valuable information to your health care provider. The images from an echocardiogram can provide information such as:  Evidence of coronary artery disease (CAD).  Heart size.  Heart muscle function.  Heart valve function.  Aneurysm detection.  Evidence of a past heart attack.  Fluid buildup around the heart.  Heart muscle thickening.  Assess heart valve function. LET YOUR HEALTH CARE PROVIDER KNOW ABOUT:  Any allergies you have.  All medicines you are taking, including vitamins, herbs, eye drops, creams, and over-the-counter medicines.  Previous problems you or members of your family have had with the use of anesthetics.  Any blood disorders you  have.  Previous surgeries you have had.  Medical conditions you have.  Possibility of pregnancy, if this applies. BEFORE THE PROCEDURE  No special preparation is needed. Eat and drink normally.  PROCEDURE   In order to produce an image of your heart, gel will be applied to your chest and a wand-like tool (transducer) will be moved over your chest. The gel will help transmit the sound waves from the transducer. The sound waves will harmlessly bounce off your heart to allow the heart images to be captured in real-time motion. These images will then be recorded.  You may need an IV to receive a medicine that improves the quality of the pictures. AFTER THE PROCEDURE You may return to your normal schedule including diet, activities, and medicines, unless your health care provider tells you otherwise.   This information is not intended to replace advice given to you by your health care provider. Make sure you discuss any questions you have with your health care provider.   Document Released: 09/02/2000 Document Revised: 09/26/2014 Document Reviewed: 05/13/2013 Elsevier Interactive Patient Education 2016 Elsevier Inc.  

## 2015-09-02 ENCOUNTER — Other Ambulatory Visit: Payer: Self-pay

## 2015-09-02 ENCOUNTER — Ambulatory Visit (INDEPENDENT_AMBULATORY_CARE_PROVIDER_SITE_OTHER): Payer: Self-pay

## 2015-09-02 DIAGNOSIS — I379 Nonrheumatic pulmonary valve disorder, unspecified: Secondary | ICD-10-CM

## 2015-09-03 ENCOUNTER — Other Ambulatory Visit: Payer: Self-pay

## 2015-09-03 DIAGNOSIS — I379 Nonrheumatic pulmonary valve disorder, unspecified: Secondary | ICD-10-CM

## 2016-08-15 ENCOUNTER — Encounter: Payer: Self-pay | Admitting: Cardiovascular Disease

## 2016-08-15 ENCOUNTER — Ambulatory Visit (INDEPENDENT_AMBULATORY_CARE_PROVIDER_SITE_OTHER): Payer: 59 | Admitting: Cardiovascular Disease

## 2016-08-15 VITALS — BP 110/68 | HR 70 | Ht 66.0 in | Wt 139.5 lb

## 2016-08-15 DIAGNOSIS — I379 Nonrheumatic pulmonary valve disorder, unspecified: Secondary | ICD-10-CM

## 2016-08-15 DIAGNOSIS — Z8774 Personal history of (corrected) congenital malformations of heart and circulatory system: Secondary | ICD-10-CM | POA: Diagnosis not present

## 2016-08-15 DIAGNOSIS — Z9889 Other specified postprocedural states: Secondary | ICD-10-CM

## 2016-08-15 NOTE — Progress Notes (Signed)
Cardiology Office Note   Date:  08/15/2016   ID:  William CardJohnathan T Pham, DOB 1986/02/06, MRN [id number]  PCP:  William Pham,William AZBELL, William Pham  Cardiologist:   Lorine BearsMuhammad Rubby Barbary, William Pham   Chief Complaint  Patient presents with  . Other     12 month f/u. Pt states he is doing well. Reviewed meds with pt verbally.      History of Present Illness: William CardJohnathan T Kindig is a 30 y.o. male who presents for afollow-up visit. He has had tetralogy of Fallot repair as an infant when he was 5510 months old at [facility]. 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 Peak gradient was 30 mmHg. Pulmonary pressure was normal. He has been doing well since then with no chest pain, shortness of breath or palpitations.    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  Past Medical History:  Diagnosis Date  . Congenital heart disease    Dr. Earma ReadingBashore    Past Surgical History:  Procedure Laterality Date  . PULMONARY VALVE REPLACEMENT  2013   Dr. Phineas InchesAndrew Lodge, [facility]     Current Outpatient Prescriptions  Medication Sig Dispense Refill  . aspirin 81 MG tablet Take 81 mg by mouth daily.     No current facility-administered medications for this visit.     Allergies:   Patient has no known allergies.    Social History:  The patient  reports that he has never smoked. He has never used smokeless tobacco. He reports that he does not drink alcohol or use drugs.   Family History:  The patient's family history includes Hypertension in his maternal grandmother and mother.    ROS:  Please see the history of present illness.   Otherwise, review of systems are positive for none.   All  other systems are reviewed and negative.    PHYSICAL EXAM: VS:  BP 110/68 (BP Location: Left Arm, Patient Position: Sitting, Cuff Size: Normal)   Pulse 70   Ht 5\' 6"  (1.676 m)   Wt 139 lb 8 oz (63.3 kg)   BMI 22.52 kg/m  , BMI Body mass index is 22.52 kg/m. GEN: Well nourished, well developed, in no acute distress  HEENT: normal  Neck: no JVD, carotid bruits, or masses Cardiac: RRR; no  rubs, or gallops,no edema . There is 2/6 crescendo decrescendo systolic murmur in the pulmonic area with slightly increased S2 Respiratory:  clear to auscultation bilaterally, normal work of breathing GI: soft, nontender, nondistended, + BS MS: no deformity or atrophy  Skin: warm and dry, no rash Neuro:  Strength and sensation are intact Psych: euthymic mood, full affect   EKG:  EKG is ordered today. The ekg ordered today demonstrates normal sinus rhythm with right bundle branch block.   Recent Labs: No results found for requested labs within last 8760 hours.    Lipid Panel No results found for: CHOL, TRIG, HDL, CHOLHDL, VLDL, LDLCALC, LDLDIRECT    Wt Readings from Last 3 Encounters:  08/15/16 139 lb 8 oz (63.3 kg)  08/18/15 135 lb (61.2 kg)  01/17/13 124 lb (56.2 kg)       No flowsheet data found.    ASSESSMENT AND  PLAN:  1.  Tetralogy of Fallot status post surgical repair: Overall he is doing well and has no cardiac symptoms. Continue observation.  2. 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 His exam and EKG are unchanged. Continue clinical monitoring and recommend repeat echocardiogram next year. He is to continue antibiotic prophylaxis before any dental procedures.   Disposition:   FU with me in 1 year  Signed,  Lorine BearsMuhammad Jocee Kissick, William Pham  08/15/2016 1:36 PM    [facility]

## 2016-08-15 NOTE — Patient Instructions (Signed)
Medication Instructions: No changes.   Labwork: None.   Procedures/Testing: None.   Follow-Up: 1 year with Dr. Arida.   Any Additional Special Instructions Will Be Listed Below (If Applicable).     If you need a refill on your cardiac medications before your next appointment, please call your pharmacy.   

## 2016-11-04 ENCOUNTER — Telehealth: Payer: Self-pay | Admitting: Cardiovascular Disease

## 2016-11-04 NOTE — Telephone Encounter (Signed)
Error

## 2016-11-06 ENCOUNTER — Emergency Department
Admission: EM | Admit: 2016-11-06 | Discharge: 2016-11-06 | Disposition: A | Discharge status: Home / Self Care | Payer: 59 | Attending: Emergency Medicine | Admitting: Emergency Medicine

## 2016-11-06 ENCOUNTER — Emergency Department: Payer: 59

## 2016-11-06 ENCOUNTER — Encounter: Payer: Self-pay | Admitting: Medical Oncology

## 2016-11-06 DIAGNOSIS — Y999 Unspecified external cause status: Secondary | ICD-10-CM | POA: Insufficient documentation

## 2016-11-06 DIAGNOSIS — X509XXA Other and unspecified overexertion or strenuous movements or postures, initial encounter: Secondary | ICD-10-CM | POA: Insufficient documentation

## 2016-11-06 DIAGNOSIS — Y939 Activity, unspecified: Secondary | ICD-10-CM | POA: Insufficient documentation

## 2016-11-06 DIAGNOSIS — S39012A Strain of muscle, fascia and tendon of lower back, initial encounter: Secondary | ICD-10-CM | POA: Insufficient documentation

## 2016-11-06 DIAGNOSIS — R079 Chest pain, unspecified: Secondary | ICD-10-CM | POA: Diagnosis not present

## 2016-11-06 DIAGNOSIS — Y929 Unspecified place or not applicable: Secondary | ICD-10-CM | POA: Diagnosis not present

## 2016-11-06 DIAGNOSIS — S3992XA Unspecified injury of lower back, initial encounter: Secondary | ICD-10-CM | POA: Diagnosis present

## 2016-11-06 DIAGNOSIS — S29012A Strain of muscle and tendon of back wall of thorax, initial encounter: Secondary | ICD-10-CM

## 2016-11-06 DIAGNOSIS — M7918 Myalgia, other site: Secondary | ICD-10-CM

## 2016-11-06 LAB — BASIC METABOLIC PANEL
ANION GAP: 8 (ref 5–15)
BUN: 12 mg/dL (ref 6–20)
CALCIUM: 9.3 mg/dL (ref 8.9–10.3)
CO2: 30 mmol/L (ref 22–32)
Chloride: 101 mmol/L (ref 101–111)
Creatinine, Ser: 0.95 mg/dL (ref 0.61–1.24)
Glucose, Bld: 94 mg/dL (ref 65–99)
POTASSIUM: 4.1 mmol/L (ref 3.5–5.1)
SODIUM: 139 mmol/L (ref 135–145)

## 2016-11-06 LAB — CBC
HEMATOCRIT: 44.2 % (ref 40.0–52.0)
HEMOGLOBIN: 14.9 g/dL (ref 13.0–18.0)
MCH: 28.8 pg (ref 26.0–34.0)
MCHC: 33.8 g/dL (ref 32.0–36.0)
MCV: 85.4 fL (ref 80.0–100.0)
Platelets: 261 10*3/uL (ref 150–440)
RBC: 5.18 MIL/uL (ref 4.40–5.90)
RDW: 13.3 % (ref 11.5–14.5)
WBC: 8.8 10*3/uL (ref 3.8–10.6)

## 2016-11-06 LAB — TROPONIN I

## 2016-11-06 MED ORDER — IOPAMIDOL (ISOVUE-370) INJECTION 76%
75.0000 mL | Freq: Once | INTRAVENOUS | Status: AC | PRN
  Administered 2016-11-06: 75 mL via INTRAVENOUS
  Filled 2016-11-06: qty 75

## 2016-11-06 MED ORDER — ACETAMINOPHEN 500 MG PO TABS
1000.0000 mg | ORAL_TABLET | ORAL | Status: AC
  Administered 2016-11-06: 1000 mg via ORAL

## 2016-11-06 MED ORDER — ACETAMINOPHEN 500 MG PO TABS
ORAL_TABLET | ORAL | Status: AC
  Administered 2016-11-06: 1000 mg via ORAL
  Filled 2016-11-06: qty 2

## 2016-11-06 NOTE — ED Provider Notes (Signed)
[facility] Emergency Department Provider Note   ____________________________________________   First MD Initiated Contact with Patient 11/06/16 1748     (approximate)  I have reviewed the triage vital signs and the nursing notes.   HISTORY  Chief Complaint Back Pain; Neck Pain; Shoulder Pain; and Chest Pain    HPI William Pham is a 31 y.o. 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 He has had a pulmonary valve, bovine takes daily aspirin  Mouth 3 days ago, patient started experience pain in his right mid back, the last few days it seems to have improved in the back, but now moved up towards his right shoulder area and around his shoulder blade. No fevers, no cough, no nausea or vomiting. Denies chest pain. Currently reports as an achiness over the right shoulder blade area. He has not noticed it made any worse by moving, but might be slightly worse when using his right arm  Has a murmur at baseline. Reports regular follow-up with cardiology with no ongoing issues noted   Past Medical History:  Diagnosis Date  . Congenital heart disease    Dr. Earma ReadingBashore    Patient Active Problem List   Diagnosis Date Noted  . 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 Anxiety 01/23/2012    Past Surgical History:  Procedure Laterality Date  . PULMONARY VALVE REPLACEMENT  2013   Dr. Phineas InchesAndrew Lodge, [facility]    Prior to Admission medications   Medication Sig Start Date End Date Taking? Authorizing Provider  aspirin 81 MG tablet Take 81 mg by mouth daily.    Historical Provider, MD    Allergies Patient has no known allergies.  Family History  Problem Relation Age of Onset  . Hypertension Maternal Grandmother   . Hypertension Mother     Social History Social History  Substance Use Topics  . Smoking status: Never Smoker  . Smokeless tobacco: Never Used  . Alcohol use No    Review of  Systems Constitutional: No fever/chills Eyes: No visual changes. ENT: No sore throat. Cardiovascular: Denies chest pain. Does have pain over the right posterior back and thoracic region Respiratory: Denies shortness of breath. Gastrointestinal: No abdominal pain.  No nausea, no vomiting.  No diarrhea.  No constipation. Genitourinary: Negative for dysuria. Musculoskeletal: Negative for back pain. Skin: Negative for rash. Neurological: Negative for headaches, focal weakness or numbness.  10-point ROS otherwise negative.  ____________________________________________   PHYSICAL EXAM:  VITAL SIGNS: ED Triage Vitals [11/06/16 1413]  Enc Vitals Group     BP 114/70     Pulse Rate 63     Resp 16     Temp 98 F (36.7 C)     Temp Source Oral     SpO2 99 %     Weight 135 lb (61.2 kg)     Height 5\' 6"  (1.676 m)     Head Circumference      Peak Flow      Pain Score 5     Pain Loc      Pain Edu?      Excl. in GC?     Constitutional: Alert and oriented. Well appearing and in no acute distress. Eyes: Conjunctivae are normal. PERRL. EOMI. Head: Atraumatic. Nose: No congestion/rhinnorhea. Mouth/Throat: Mucous membranes are moist.  Oropharynx non-erythematous. Neck: No stridor.   Cardiovascular: Normal rate, regular rhythm. A modest, systolic murmur noted most prominently over the pulmonic listing  area.  Good peripheral circulation.Anterior chest nontender. Midline sternotomy scar. Respiratory: Normal respiratory effort.  No retractions. Lungs CTAB. Gastrointestinal: Soft and nontender. No distention. No abdominal bruits. No CVA tenderness. Musculoskeletal: No lower extremity tenderness nor edema.  No joint effusions. He does report tenderness to palpation of the right upper thoracic paraspinous region without defect or rash. It is reproducible somewhat with movement and abduction of the right shoulder Neurologic:  Normal speech and language. No gross focal neurologic deficits are  appreciated. No gait instability. Skin:  Skin is warm, dry and intact. No rash noted. Psychiatric: Mood and affect are normal. Speech and behavior are normal.  ____________________________________________   LABS (all labs ordered are listed, but only abnormal results are displayed)  Labs Reviewed  BASIC METABOLIC PANEL  CBC  TROPONIN I   ____________________________________________  EKG  ED ECG REPORT I, Jailine Lieder, the attending physician, personally viewed and interpreted this ECG.  Date: 11/06/2016 EKG Time: 1410 Rate: 60 Rhythm: normal sinus rhythm QRS Axis: normal Intervals: normal ST/T Wave abnormalities: normal Conduction Disturbances: RBBB Narrative Interpretation: RBBB, otherwise normal  ____________________________________________  RADIOLOGY  Dg Chest 2 View  Result Date: 11/06/2016 CLINICAL DATA:  Chest pain.  History of tetralogy of Fallot EXAM: CHEST  2 VIEW COMPARISON:  October 21, 2011 FINDINGS: There is no edema or consolidation. The heart is upper normal in size with pulmonary vascularity within normal limits. The patient has had pulmonic valve replacement. Cardiac apex and aortic arch are on the left side. No adenopathy. No pneumothorax. No bone lesions. IMPRESSION: Status post pulmonic valve replacement. Heart size upper normal. Lungs clear. Electronically Signed   By: Bretta Bang III M.D.   On: 11/06/2016 14:52   Ct Angio Chest Aorta W And/or Wo Contrast  Result Date: 11/06/2016 CLINICAL DATA:  31 year old male with complaint of back pain worsening over the past few days with. History of Tetralogy of Fallot. EXAM: CT ANGIOGRAPHY CHEST WITH CONTRAST TECHNIQUE: Multidetector CT imaging of the chest was performed using the standard protocol during bolus administration of intravenous contrast. Multiplanar CT image reconstructions and MIPs were obtained to evaluate the vascular anatomy. CONTRAST:  75 mL of Isovue 370. COMPARISON:  No priors. FINDINGS:  Cardiovascular: Surgical changes of Tetralogy of Fallot correction with bioprosthetic pulmonic valve. Mild cardiomegaly with right ventricular dilatation. There is no significant pericardial fluid, thickening or pericardial calcification. No atherosclerotic calcifications are noted in the thoracic aorta or the coronary arteries. Mediastinum/Nodes: No pathologically enlarged mediastinal or hilar lymph nodes. Esophagus is unremarkable in appearance. No axillary lymphadenopathy. Lungs/Pleura: No consolidative airspace disease. No pleural effusions. No suspicious appearing pulmonary nodules or masses. Upper Abdomen: Unremarkable. Musculoskeletal: Median sternotomy wires. There are no aggressive appearing lytic or blastic lesions noted in the visualized portions of the skeleton. Review of the MIP images confirms the above findings. IMPRESSION: 1. No acute findings in the thorax to account for the patient's symptoms. 2. Cardiomegaly with postoperative changes of surgically corrected Tetralogy of Fallot. Electronically Signed   By: Trudie Reed M.D.   On: 11/06/2016 19:00    ____________________________________________   PROCEDURES  Procedure(s) performed: None  Procedures  Critical Care performed: No  ____________________________________________   INITIAL IMPRESSION / ASSESSMENT AND PLAN / ED COURSE  Pertinent labs & imaging results that were available during my care of the patient were reviewed by me and considered in my medical decision making (see chart for details).  Right sided back pain. Started in the mid to lower thoracic region, this  is now improved but now having discomfort in the right upper thoracic region. Afebrile, very well-appearing and in no distress. Somewhat reproducible, but given the patient's history of cardiac disease in the pulmonic valve replacement with associated murmur I did entertain, though find it very unlikely, possibility of an acute dissection is considered or  other acute chest abnormality. Discussed with the patient, in his family agreeable plan for CT to exclude concerning causes of pain that could radiate or cause moving pain toward his upper back. He denies any abdominal symptoms. Afebrile. Normal white count.  Clinical Course as of Nov 07 2311  Sun Nov 06, 2016  1748 DG Chest 2 View [MQ]    Clinical Course User Index [MQ] Sharyn Creamer, MD   ----------------------------------------- 8:11 PM on 11/06/2016 -----------------------------------------  CT scan results reviewed, no acute. Patient reports he feels well, comfortable with the plan to go home. Discussed them likely musculoskeletal in nature, but given his previous history we discuss careful return precautions and follow-up. He is agreeable to plan, treatment with over-the-counter and conservative measures. He is awake alert in no distress with normal stable hemodynamics. Currently reports only mild achiness in the right upper back.  Return precautions and treatment recommendations and follow-up discussed with the patient who is agreeable with the plan.   ____________________________________________   FINAL CLINICAL IMPRESSION(S) / ED DIAGNOSES  Final diagnoses:  Pain of paraspinal muscle  Muscle strain of right upper back, initial encounter      NEW MEDICATIONS STARTED DURING THIS VISIT:  Discharge Medication List as of 11/06/2016  8:11 PM       Note:  This document was prepared using Dragon voice recognition software and may include unintentional dictation errors.     Sharyn Creamer, MD 11/06/16 [phone]

## 2016-11-06 NOTE — Discharge Instructions (Signed)
°  Return to the Emergency Department (ED) if you experience any new concerns, fever, or symptoms such as chest pain/pressure/tightness, difficulty breathing, or sudden sweating, or other symptoms that concern you.

## 2016-11-06 NOTE — ED Triage Notes (Signed)
Pt reports that he began Wednesday having lower back pain like he pulled a muscle, since then the pain has began to move into his neck, bilt shoulders and into his chest. Pt denies injury, in NAD at this time.

## 2016-11-07 ENCOUNTER — Telehealth: Payer: Self-pay | Admitting: Cardiovascular Disease

## 2016-11-07 NOTE — Telephone Encounter (Signed)
Lmom to call our office to schedule an echocardiogram (1 yr f/u).

## 2016-11-25 ENCOUNTER — Encounter: Payer: Self-pay | Admitting: *Deleted

## 2017-12-22 IMAGING — CT CT ANGIO CHEST
3 of 7 series · 18 of 46 positions shown · IV contrast (isovue)
Comparison: No priors.

CLINICAL DATA: 30-year-old male with complaint of back pain
worsening over the past few days with. History of Tetralogy of
Latonya.

EXAM:
CT ANGIOGRAPHY CHEST WITH CONTRAST
TECHNIQUE: Multidetector CT imaging of the chest was performed using the
standard protocol during bolus administration of intravenous
contrast. Multiplanar CT image reconstructions and MIPs were
obtained to evaluate the vascular anatomy.
CONTRAST:  75 mL of Isovue 370.

[Series 6: axial arterial · axial · arterial · 0.67mm/px · z∈[-257,-32]mm · 12 of 89 slices shown]
[im 7/89  lung]
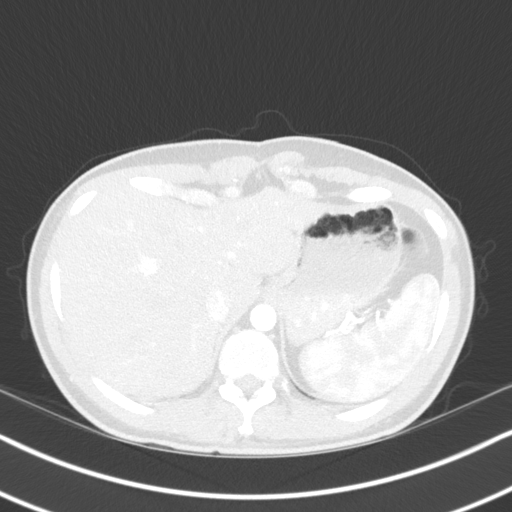
[im 14/89  soft-tissue]
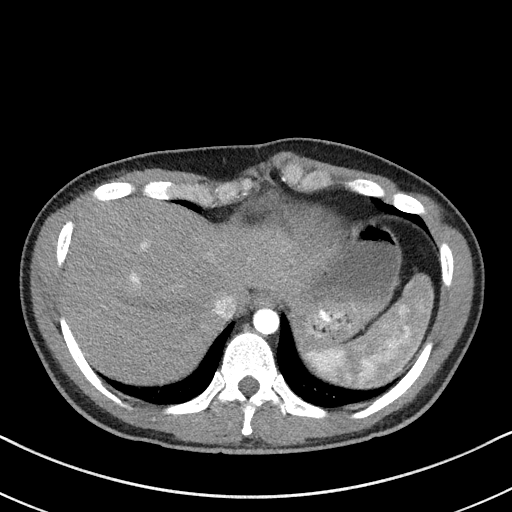
[im 21/89  lung]
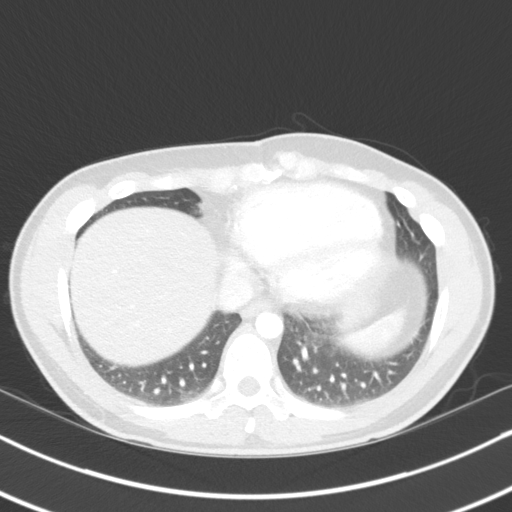
[im 28/89  soft-tissue]
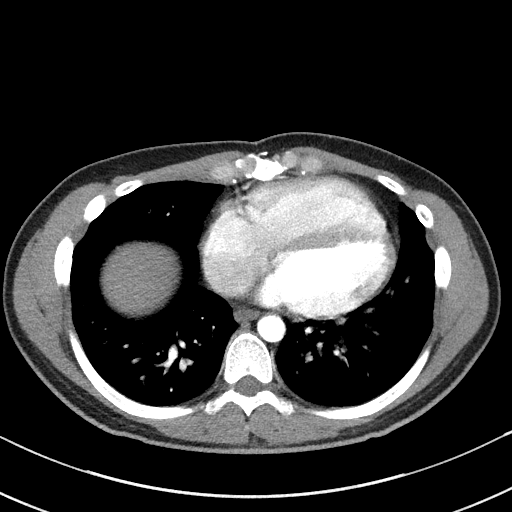
[im 34/89  lung]
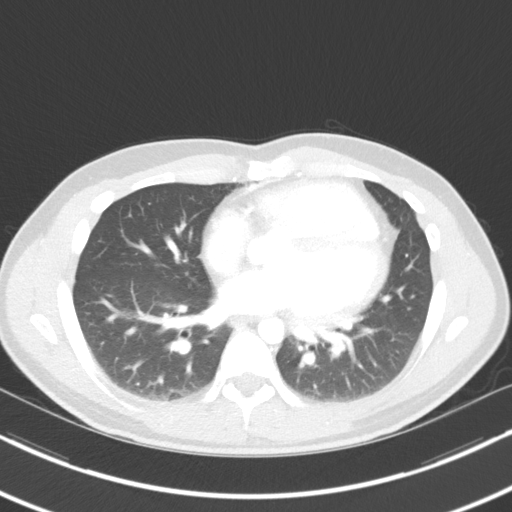
[im 41/89  soft-tissue]
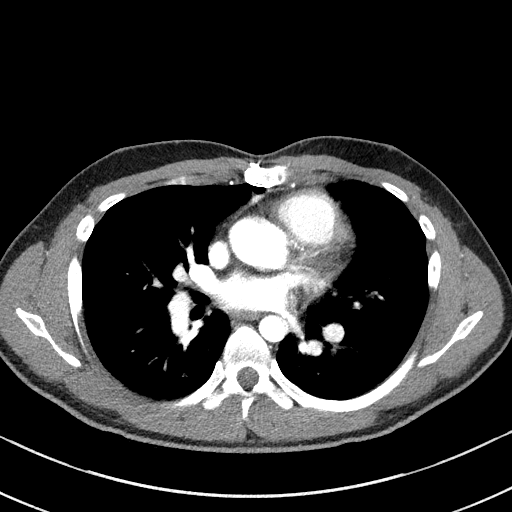
[im 48/89  lung]
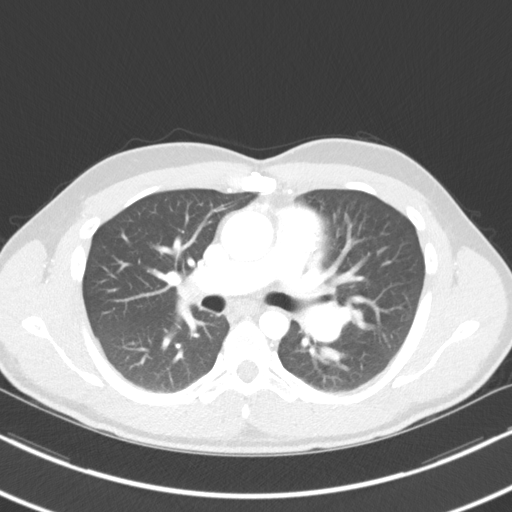
[im 55/89  soft-tissue]
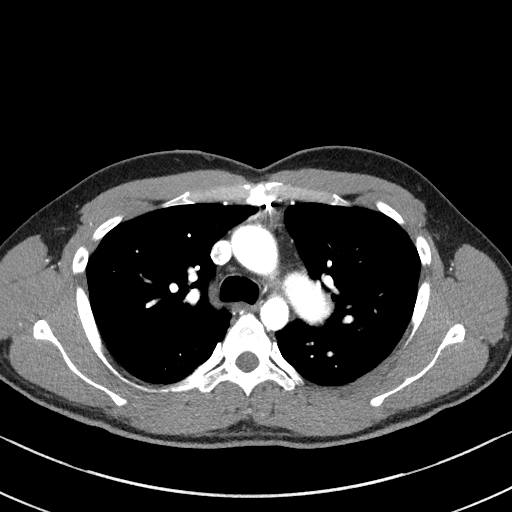
[im 61/89  lung]
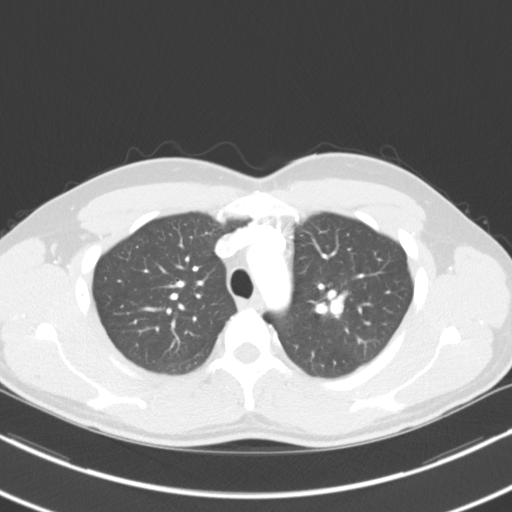
[im 68/89  soft-tissue]
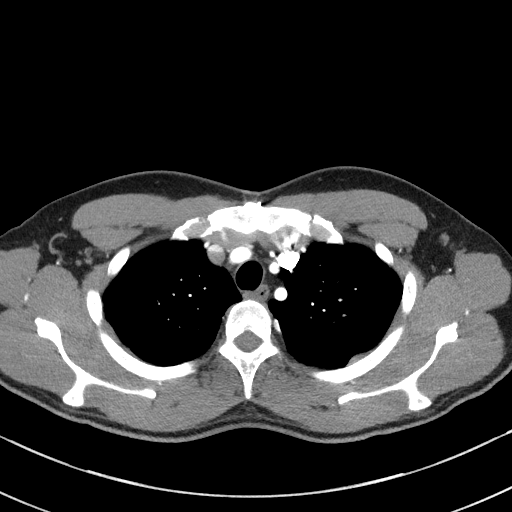
[im 75/89  lung]
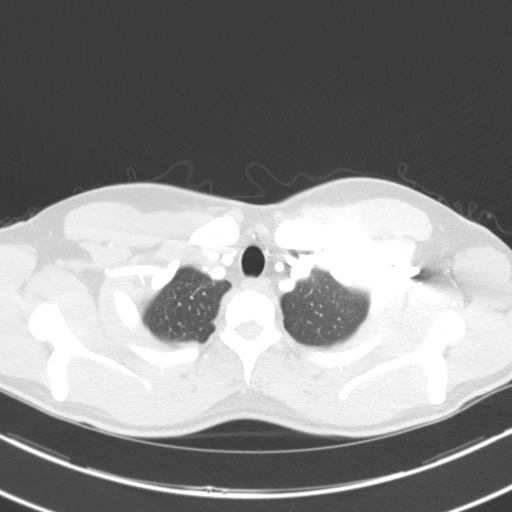
[im 82/89  soft-tissue]
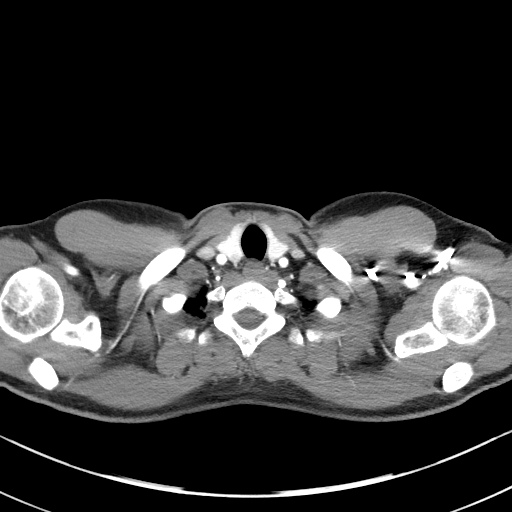

[Series 7: lung · axial · 0.67mm/px · z∈[-246,-181]mm · 3 of 54 slices shown]
[im 7/54  soft-tissue]
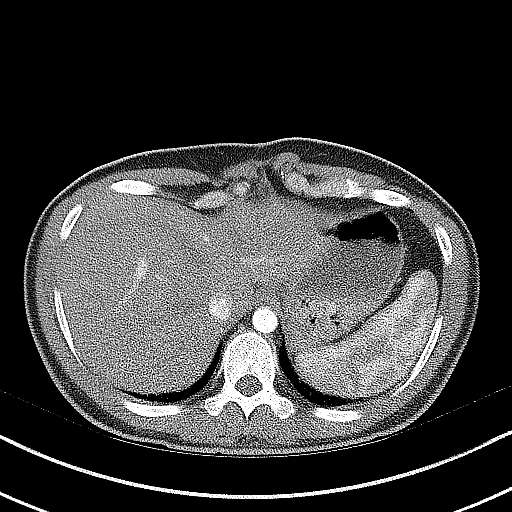
[im 14/54  soft-tissue]
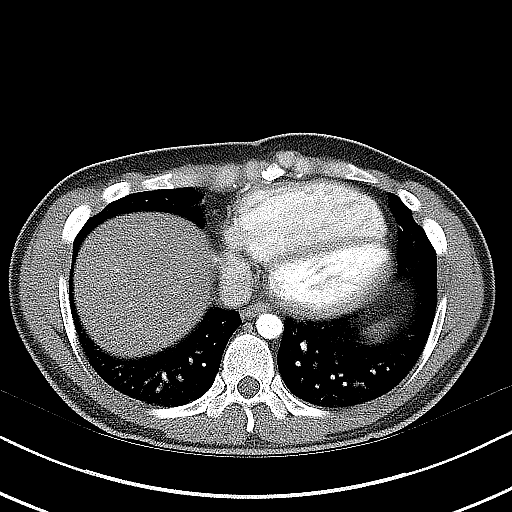
[im 20/54  soft-tissue]
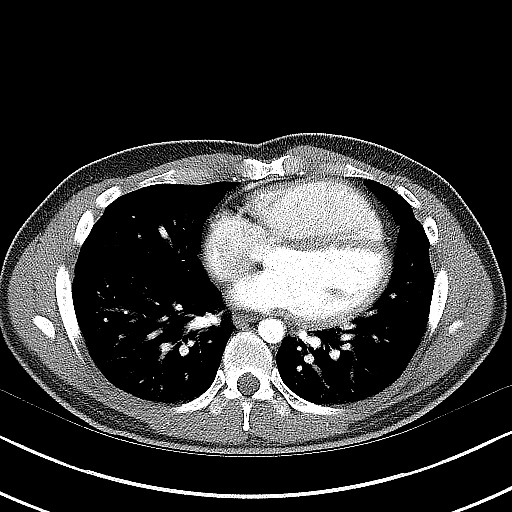

[Series 8: coronals · coronal · 0.52mm/px · 3 of 103 slices shown]
[im 26/103  soft-tissue]
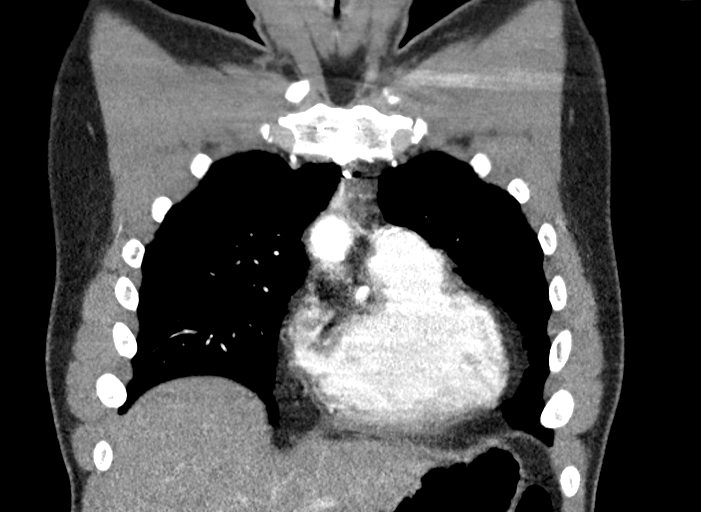
[im 52/103  soft-tissue]
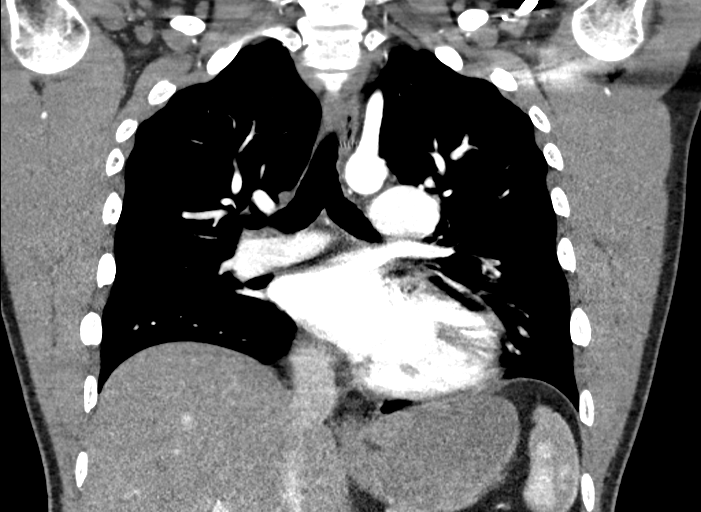
[im 77/103  soft-tissue]
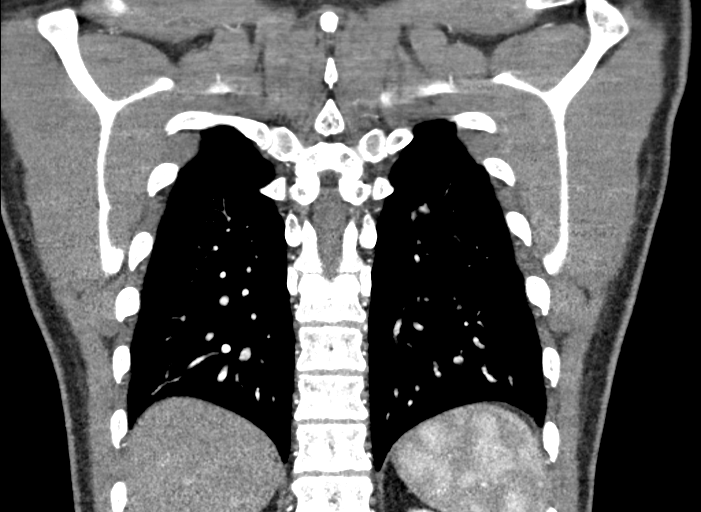

[18 of 46 positions shown; findings below may reference images not displayed]

FINDINGS: Cardiovascular: Surgical changes of Tetralogy of Latonya correction
with bioprosthetic pulmonic valve. Mild cardiomegaly with right
ventricular dilatation. There is no significant pericardial fluid,
thickening or pericardial calcification. No atherosclerotic
calcifications are noted in the thoracic aorta or the coronary
arteries.

Mediastinum/Nodes: No pathologically enlarged mediastinal or hilar
lymph nodes. Esophagus is unremarkable in appearance. No axillary
lymphadenopathy.

Lungs/Pleura: No consolidative airspace disease. No pleural
effusions. No suspicious appearing pulmonary nodules or masses.

Upper Abdomen: Unremarkable.

Musculoskeletal: Median sternotomy wires. There are no aggressive
appearing lytic or blastic lesions noted in the visualized portions
of the skeleton.

Review of the MIP images confirms the above findings.
IMPRESSION: 1. No acute findings in the thorax to account for the patient's
symptoms.
2. Cardiomegaly with postoperative changes of surgically corrected
Tetralogy of Latonya.

## 2017-12-22 IMAGING — CR DG CHEST 2V
1 series · 2 of 2 positions shown · non-contrast
Comparison: October 21, 2011

CLINICAL DATA: Chest pain.  History of tetralogy of Ruimanuel

EXAM:
CHEST  2 VIEW

[Series 1: dg chest 2 view · 0.14mm/px · 2 of 2 slices shown]
[im 1/2]
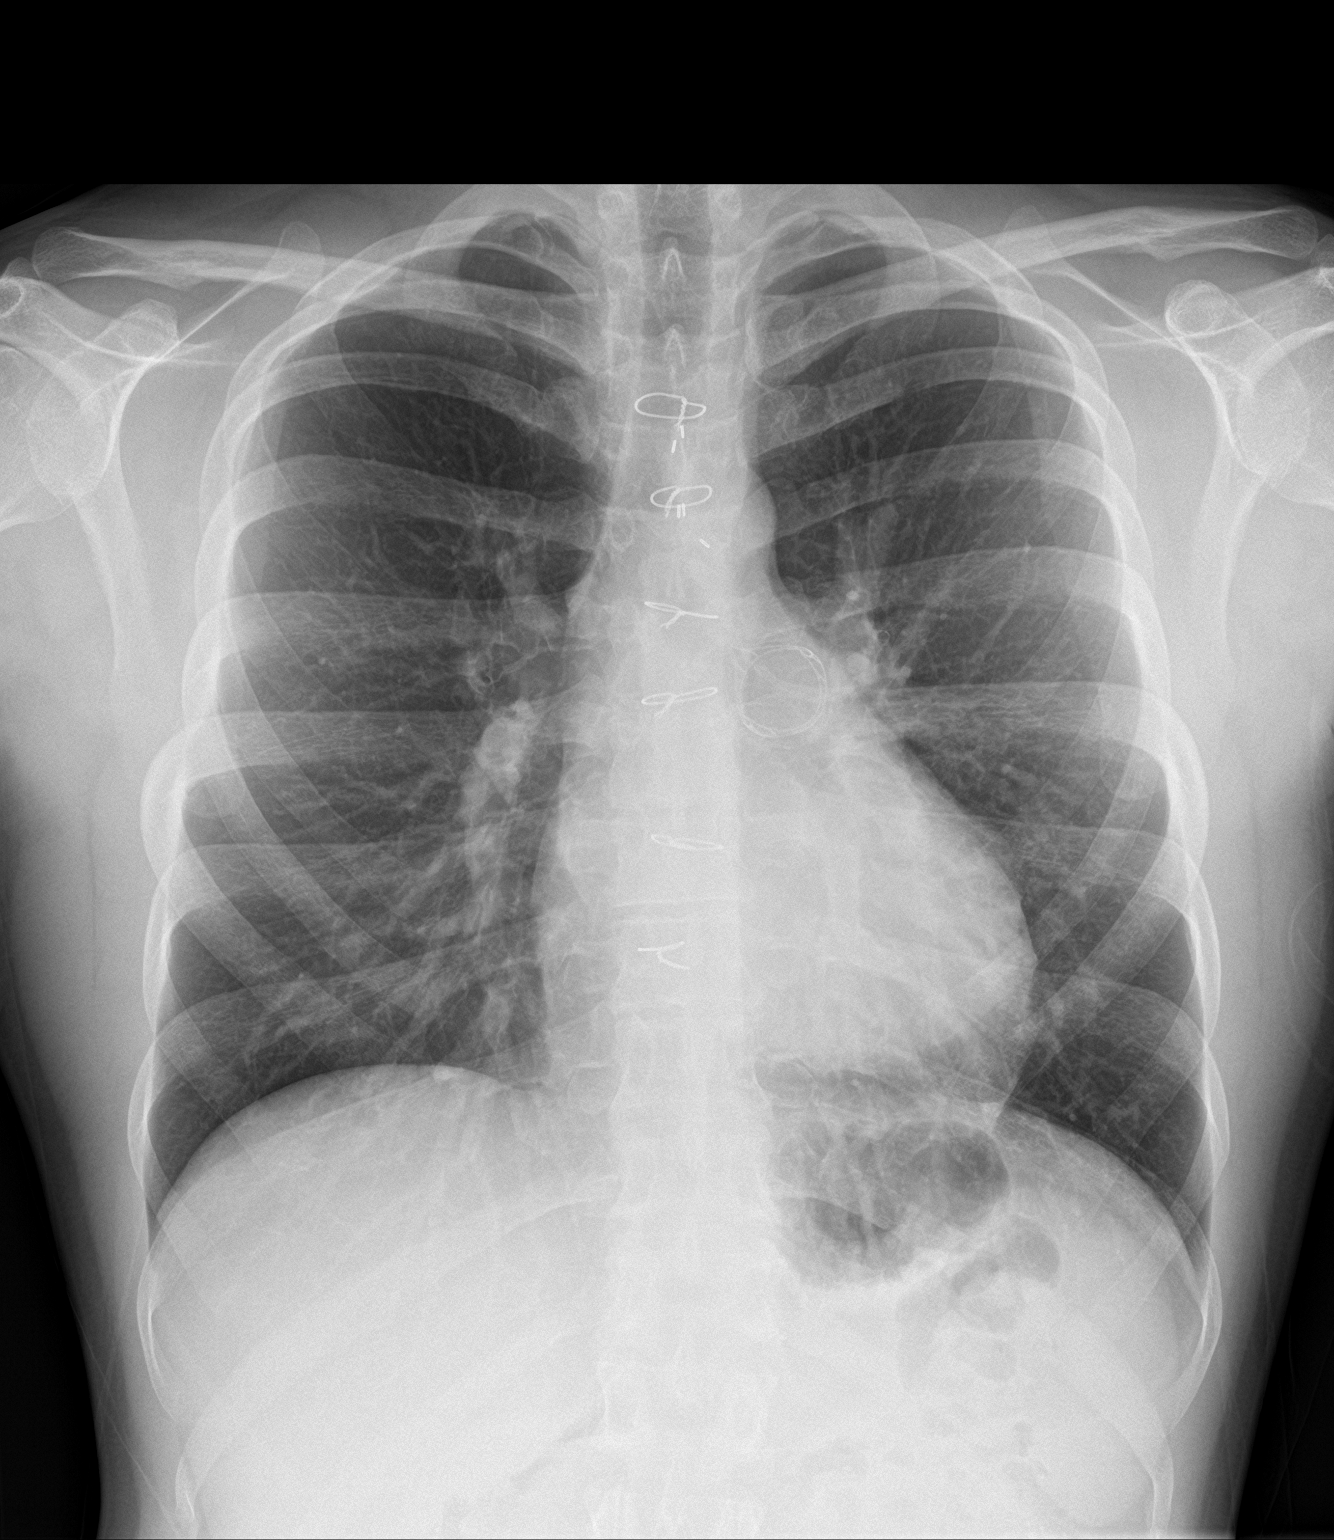
[im 2/2]
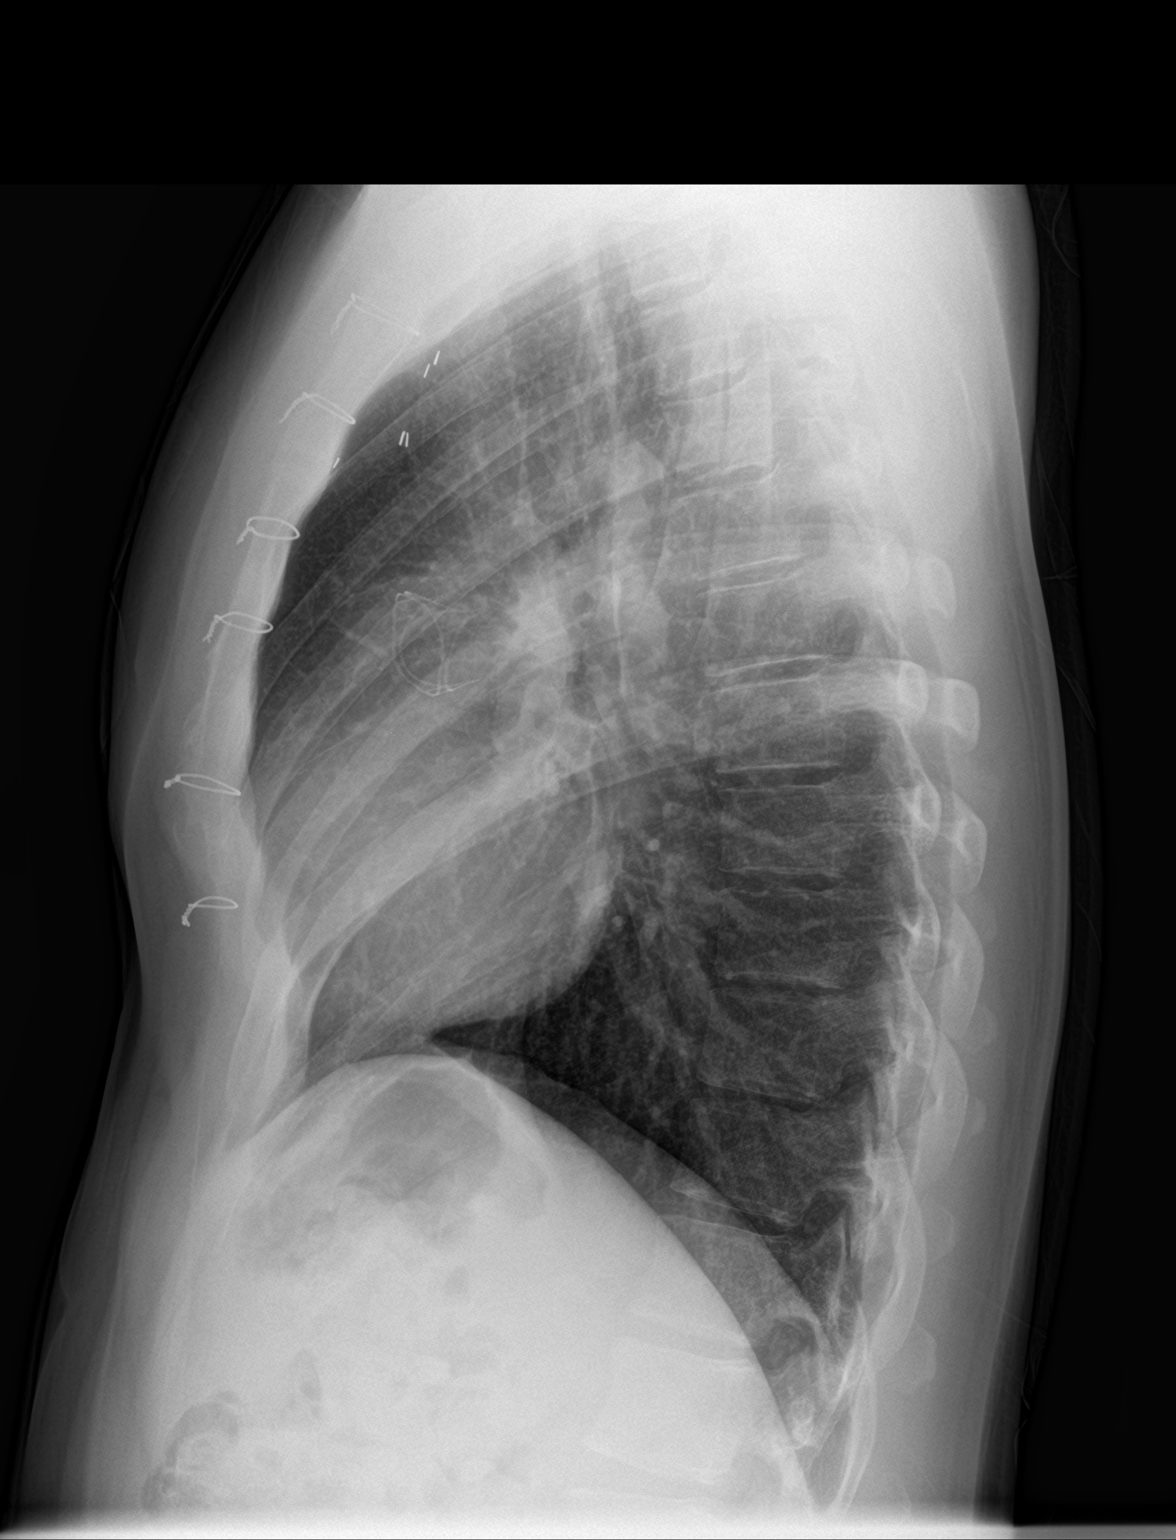

[2 of 2 positions shown; findings below may reference images not displayed]

FINDINGS: There is no edema or consolidation. The heart is upper normal in
size with pulmonary vascularity within normal limits. The patient
has had pulmonic valve replacement. Cardiac apex and aortic arch are
on the left side. No adenopathy. No pneumothorax. No bone lesions.
IMPRESSION: Status post pulmonic valve replacement. Heart size upper normal.
Lungs clear.

## 2018-05-01 ENCOUNTER — Encounter: Payer: Self-pay | Admitting: Physician Assistant

## 2018-05-01 ENCOUNTER — Ambulatory Visit (INDEPENDENT_AMBULATORY_CARE_PROVIDER_SITE_OTHER): Payer: BLUE CROSS/BLUE SHIELD | Admitting: Physician Assistant

## 2018-05-01 VITALS — BP 112/70 | HR 70 | Wt 148.0 lb

## 2018-05-01 DIAGNOSIS — I379 Nonrheumatic pulmonary valve disorder, unspecified: Secondary | ICD-10-CM | POA: Diagnosis not present

## 2018-05-01 DIAGNOSIS — Z8774 Personal history of (corrected) congenital malformations of heart and circulatory system: Secondary | ICD-10-CM | POA: Diagnosis not present

## 2018-05-01 NOTE — Patient Instructions (Signed)
Medication Instructions:  Your physician recommends that you continue on your current medications as directed. Please refer to the Current Medication list given to you today.   Labwork: NONE  Testing/Procedures: Your physician has requested that you have an echocardiogram. Echocardiography is a painless test that uses sound waves to create images of your heart. It provides your doctor with information about the size and shape of your heart and how well your heart's chambers and valves are working. This procedure takes approximately one hour. There are no restrictions for this procedure. You may get an IV, if needed, to receive an ultrasound enhancing agent through to better visualize your heart.    Follow-Up: Your physician recommends that you schedule a follow-up appointment in: 12 MONTHS WITH DR ARIDA.   If you need a refill on your cardiac medications before your next appointment, please call your pharmacy.

## 2018-05-01 NOTE — Progress Notes (Signed)
Cardiology Office Note Date:  05/01/2018  Patient ID:  William Pham, DOB 04/05/1986, MRN [id number] PCP:  Patient, No Pcp Per  Cardiologist:  Dr. Kirke CorinArida, MD    Chief Complaint: Follow up  History of Present Illness: William CardJohnathan T Pham is a 32 y.o. male with history of Tetralogy of Fallot repair as an infant when he was 6810 months old [facility], status post pulmonary valve replacement with a 27 mm [employer] Trifecta bioprosthetic valve on 11/15/2011 [facility] for severe pulmonary valve insufficiency who presents for follow up.   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 He was most recently seen in the office on 08/15/2016 and was doing well at that time without chest pain, SOB, or palpitations. BP was well controlled at 110/68 with a weight of 139 pounds. EKG was unchanged from prior. He was seen in the ED in 10/2016 with MSK chest and back pain. CTA of the aorta was not acute with post-operative changes noted consistent with Tetralogy of Fallot.   He comes in accompanied by his wife today.  He continues to do well.  He has not had any chest pain, shortness of breath, palpitations, dizziness, presyncope, or syncope.  He continues to exercise through walking with his wife on an almost daily basis without any symptoms.  He does not feel like his quality of life is limited in any fashion.  He does not smoke, drink, or abuse illegal drugs.  He does not have any issues or concerns at this time.  Past Medical History:  Diagnosis Date  . Congenital heart disease    Dr. Earma ReadingBashore    Past Surgical History:  Procedure Laterality Date  . PULMONARY VALVE REPLACEMENT  2013   Dr. Phineas InchesAndrew Lodge, [facility]    No outpatient medications have been marked as taking for the 05/01/18 encounter (Office Visit) with Sondra Bargesunn, Ryan M, PA-C.    Allergies:    Patient has no known allergies.   Social History:  The patient  reports that he has never smoked. He has never used smokeless tobacco. He reports that he does not drink alcohol or use drugs.   Family History:  The patient's family history includes Hypertension in his maternal grandmother and mother.  ROS:   Review of Systems  Constitutional: Negative for chills, diaphoresis, fever, malaise/fatigue and weight loss.  HENT: Negative for congestion.   Eyes: Negative for discharge and redness.  Respiratory: Negative for cough, hemoptysis, sputum production, shortness of breath and wheezing.   Cardiovascular: Negative for chest pain, palpitations, orthopnea, claudication, leg swelling and PND.  Gastrointestinal: Negative for abdominal pain, blood in stool, heartburn, melena, nausea and vomiting.  Genitourinary: Negative for hematuria.  Musculoskeletal: Negative for falls and myalgias.  Skin: Negative for rash.  Neurological: Negative for dizziness, tingling, tremors, sensory change, speech change, focal weakness, loss of consciousness and weakness.  Endo/Heme/Allergies: Does not bruise/bleed easily.  Psychiatric/Behavioral: Negative for substance abuse. The patient is not nervous/anxious.   All other systems reviewed and are negative.    PHYSICAL EXAM:  VS:  BP 112/70 (BP Location: Right Arm, Patient Position: Sitting, Cuff Size: Normal)   Pulse 70   Wt 148 lb (67.1 kg)   BMI 23.89 kg/m  BMI: Body mass index is 23.89 kg/m.  Physical Exam  Constitutional: He is oriented to person, place, and time.  He appears well-developed and well-nourished.  HENT:  Head: Normocephalic and atraumatic.  Eyes: Right eye exhibits no discharge. Left eye exhibits no discharge.  Neck: Normal range of motion. No JVD present.  Cardiovascular: Normal rate, regular rhythm, S1 normal and S2 normal. Exam reveals no distant heart sounds, no friction rub, no midsystolic click and no opening snap.  Murmur  heard. Pulses:      Dorsalis pedis pulses are 2+ on the right side, and 2+ on the left side.       Posterior tibial pulses are 2+ on the right side, and 2+ on the left side.  2/6 crescendo/decrescendo systolic murmur in the pulmonic area  Pulmonary/Chest: Effort normal and breath sounds normal. No respiratory distress. He has no decreased breath sounds. He has no wheezes. He has no rales. He exhibits no tenderness.  Abdominal: Soft. He exhibits no distension. There is no tenderness.  Musculoskeletal: He exhibits no edema.  Neurological: He is alert and oriented to person, place, and time.  Skin: Skin is warm and dry. No cyanosis. Nails show no clubbing.  Psychiatric: He has a normal mood and affect. His speech is normal and behavior is normal. Judgment and thought content normal.     EKG:  Was ordered and interpreted by me today. Shows NSR, 70 bpm, RBBB (unchanged from prior)  Recent Labs: No results found for requested labs within last 8760 hours.  No results found for requested labs within last 8760 hours.   CrCl cannot be calculated (Patient's most recent lab result is older than the maximum 21 days allowed.).   Wt Readings from Last 3 Encounters:  05/01/18 148 lb (67.1 kg)  11/06/16 135 lb (61.2 kg)  08/15/16 139 lb 8 oz (63.3 kg)     Other studies reviewed: Additional studies/records reviewed today include: summarized above  ASSESSMENT AND PLAN:  1. Tetralogy of flow status post surgical repair at age 31 months: He continues to do well without any cardiac symptoms.  Continue clinical observation.  2. 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  EKG is unchanged today.  Schedule echocardiogram to trend his valvular heart disease.  Should this show a significant change we could consider cardiac MRI.  He was again advised to continue antibiotic prophylaxis before any  dental procedures.  Disposition: F/u with Dr. Kirke CorinArida in 12 months.  Current medicines are reviewed at length with the patient today.  The patient did not have any concerns regarding medicines.  Signed, Eula Listenyan Dunn, PA-C 05/01/2018 2:46 PM     [facility] [place] [address] [place], [place] [address] [phone]

## 2018-05-01 NOTE — Addendum Note (Signed)
Addended by: Stann MainlandLARK, Ferlin Fairhurst O on: 05/01/2018 03:08 PM   Modules accepted: Orders

## 2018-05-08 ENCOUNTER — Ambulatory Visit (INDEPENDENT_AMBULATORY_CARE_PROVIDER_SITE_OTHER): Payer: BLUE CROSS/BLUE SHIELD

## 2018-05-08 ENCOUNTER — Other Ambulatory Visit: Payer: Self-pay

## 2018-05-08 DIAGNOSIS — I379 Nonrheumatic pulmonary valve disorder, unspecified: Secondary | ICD-10-CM

## 2018-05-08 DIAGNOSIS — Z8774 Personal history of (corrected) congenital malformations of heart and circulatory system: Secondary | ICD-10-CM

## 2018-05-09 ENCOUNTER — Other Ambulatory Visit: Payer: Self-pay | Admitting: *Deleted

## 2018-05-09 DIAGNOSIS — I379 Nonrheumatic pulmonary valve disorder, unspecified: Secondary | ICD-10-CM

## 2018-10-03 ENCOUNTER — Emergency Department: Payer: BLUE CROSS/BLUE SHIELD

## 2018-10-03 ENCOUNTER — Telehealth: Payer: Self-pay | Admitting: *Deleted

## 2018-10-03 ENCOUNTER — Encounter: Payer: Self-pay | Admitting: Emergency Medicine

## 2018-10-03 ENCOUNTER — Emergency Department
Admission: EM | Admit: 2018-10-03 | Discharge: 2018-10-03 | Disposition: A | Discharge status: Home / Self Care | Payer: BLUE CROSS/BLUE SHIELD | Attending: Emergency Medicine | Admitting: Emergency Medicine

## 2018-10-03 ENCOUNTER — Other Ambulatory Visit: Payer: Self-pay

## 2018-10-03 DIAGNOSIS — R079 Chest pain, unspecified: Secondary | ICD-10-CM | POA: Insufficient documentation

## 2018-10-03 LAB — BASIC METABOLIC PANEL
Anion gap: 7 (ref 5–15)
BUN: 12 mg/dL (ref 6–20)
CALCIUM: 9.1 mg/dL (ref 8.9–10.3)
CHLORIDE: 105 mmol/L (ref 98–111)
CO2: 27 mmol/L (ref 22–32)
CREATININE: 0.93 mg/dL (ref 0.61–1.24)
GFR calc Af Amer: >60 mL/min (ref >60)
GFR calc non Af Amer: >60 mL/min (ref >60)
Glucose, Bld: 110 mg/dL — ABNORMAL HIGH (ref 70–99)
Potassium: 3.7 mmol/L (ref 3.5–5.1)
Sodium: 139 mmol/L (ref 135–145)

## 2018-10-03 LAB — CBC
HEMATOCRIT: 45.6 % (ref 39.0–52.0)
Hemoglobin: 15.3 g/dL (ref 13.0–17.0)
MCH: 29.4 pg (ref 26.0–34.0)
MCHC: 33.6 g/dL (ref 30.0–36.0)
MCV: 87.7 fL (ref 80.0–100.0)
NRBC: 0 % (ref 0.0–0.2)
Platelets: 255 10*3/uL (ref 150–400)
RBC: 5.2 MIL/uL (ref 4.22–5.81)
RDW: 12.7 % (ref 11.5–15.5)
WBC: 8.8 10*3/uL (ref 4.0–10.5)

## 2018-10-03 LAB — TROPONIN I: Troponin I: <0.03 ng/mL (ref <0.03)

## 2018-10-03 MED ORDER — SODIUM CHLORIDE 0.9% FLUSH: 3.0000 mL | Freq: Once | INTRAVENOUS | Status: DC

## 2018-10-03 NOTE — ED Notes (Signed)
Discussed resolved arm weakness with Dr Mayford Knife, no head CT at this time.

## 2018-10-03 NOTE — ED Triage Notes (Signed)
Pt here with c/o left sided chest pressure that began as soon as he woke up this am, has had valve repair in the past, states left arm felt weaker than right for about 20 min, no other symptoms. Describes the pain as "minimal pressure" at this time, NAD.

## 2018-10-03 NOTE — Telephone Encounter (Signed)
Left a message for the patient to call back. He will need a follow up appointment with Dr. Kirke Corin after his ED visit for chest pain.

## 2018-10-03 NOTE — ED Notes (Signed)
Patient transported to X-ray 

## 2018-10-03 NOTE — ED Provider Notes (Signed)
[facility] Emergency Department Provider Note       Time seen: ----------------------------------------- 9:26 AM on 10/03/2018 -----------------------------------------   I have reviewed the triage vital signs and the nursing notes.  HISTORY   Chief Complaint Chest Pain    HPI Carlisle BeersJohnathan T Clint GuyLindley is a 33 y.o. male with a history of congenital heart disease, pulmonary valve disease, tetralogy of flow repair, anxiety who presents to the ED for left-sided chest pressure that began as soon as he woke up this morning.  Patient reports his left arm felt weaker than his right for about 20 minutes.  He is not had any other symptoms.  Past Medical History:  Diagnosis Date  . Congenital heart disease    Dr. Earma ReadingBashore    Patient Active Problem List   Diagnosis Date Noted  . 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 Anxiety 01/23/2012    Past Surgical History:  Procedure Laterality Date  . PULMONARY VALVE REPLACEMENT  2013   Dr. Phineas InchesAndrew Lodge, [facility]    Allergies Patient has no known allergies.  Social History Social History   Tobacco Use  . Smoking status: Never Smoker  . Smokeless tobacco: Never Used  Substance Use Topics  . Alcohol use: No  . Drug use: No    Review of Systems Constitutional: Negative for fever. Cardiovascular: Positive for chest discomfort Respiratory: Negative for shortness of breath. Gastrointestinal: Negative for abdominal pain, vomiting and diarrhea. Musculoskeletal: Negative for back pain. Skin: Negative for rash. Neurological: Negative for headaches, focal weakness or numbness.  All systems negative/normal/unremarkable except as stated in the HPI  ____________________________________________   PHYSICAL EXAM:  VITAL SIGNS: ED Triage Vitals  Enc Vitals Group     BP 10/03/18 0915 123/78     Pulse Rate 10/03/18 0915 71     Resp 10/03/18 0915 18     Temp 10/03/18 0915 98.2  F (36.8 C)     Temp Source 10/03/18 0915 Oral     SpO2 10/03/18 0915 97 %     Weight 10/03/18 0917 130 lb (59 kg)     Height 10/03/18 0917 5\' 7"  (1.702 m)     Head Circumference --      Peak Flow --      Pain Score 10/03/18 0915 0     Pain Loc --      Pain Edu? --      Excl. in GC? --    Constitutional: Alert and oriented. Well appearing and in no distress. Eyes: Conjunctivae are normal. Normal extraocular movements. ENT      Head: Normocephalic and atraumatic.      Nose: No congestion/rhinnorhea.      Mouth/Throat: Mucous membranes are moist.      Neck: No stridor. Cardiovascular: Normal rate, regular rhythm.  Systolic murmur is noted Respiratory: Normal respiratory effort without tachypnea nor retractions. Breath sounds are clear and equal bilaterally. No wheezes/rales/rhonchi. Gastrointestinal: Soft and nontender. Normal bowel sounds Musculoskeletal: Nontender with normal range of motion in extremities. No lower extremity tenderness nor edema. Neurologic:  Normal speech and language. No gross focal neurologic deficits are appreciated.  Skin:  Skin is warm, dry and intact. No rash noted. Psychiatric: Mood and affect are normal. Speech and behavior are normal.  ____________________________________________  EKG: Interpreted by me.  Sinus rhythm rate 67 bpm, right bundle branch block, T wave abnormalities, normal QT  ____________________________________________  ED COURSE:  As part of my medical decision making,  I reviewed the following data within the electronic MEDICAL RECORD NUMBER History obtained from family if available, nursing notes, old chart and ekg, as well as notes from prior ED visits. Patient presented for chest discomfort, we will assess with labs and imaging as indicated at this time.   Procedures ____________________________________________   LABS (pertinent positives/negatives)  Labs Reviewed  BASIC METABOLIC PANEL - Abnormal; Notable for the following  components:      Result Value   Glucose, Bld 110 (*)    All other components within normal limits  CBC  TROPONIN I  TROPONIN I    RADIOLOGY Images were viewed by me  IMPRESSION: Stable mild cardiomegaly. No acute cardiopulmonary disease. Stable examination.  ____________________________________________   DIFFERENTIAL DIAGNOSIS Musculoskeletal pain, GERD, anxiety, unstable angina, PE    FINAL ASSESSMENT AND PLAN  Chest pain   Plan: The patient had presented for nonspecific chest discomfort and left arm weakness of uncertain etiology. Patient's labs did not reveal any acute process. Patient's imaging was negative and he has not had any symptom recurrence.  Repeat troponin was negative here, he will be referred to cardiology for close outpatient follow-up.   Ulice Dash, MD    Note: This note was generated in part or whole with voice recognition software. Voice recognition is usually quite accurate but there are transcription errors that can and very often do occur. I apologize for any typographical errors that were not detected and corrected.     Emily Filbert, MD 10/03/18 1228

## 2018-10-04 NOTE — Telephone Encounter (Signed)
Patient has an appointment on 10/09/2018.

## 2018-10-08 NOTE — Progress Notes (Deleted)
Cardiology Office Note Date:  10/08/2018  Patient ID:  William Pham [birth year], MRN [id number] PCP:  Patient, No Pcp Per  Cardiologist:  Dr. Kirke Corin, MD  ***refresh   Chief Complaint: ED follow-up  History of Present Illness: William Pham is a 33 y.o. 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 He was most recently seen in the office on 08/15/2016 and was doing well at that time without chest pain, SOB, or palpitations. BP was well controlled at 110/68 with a weight of 139 pounds. EKG was unchanged from prior. He was seen in the ED in 10/2016 with MSK chest and back pain. CTA of the aorta was not acute with post-operative changes noted consistent with Tetralogy of Fallot.   He was last seen in the office in 04/2018 and was doing well.  He denied any further chest pain, shortness of breath, palpitations, dizziness, presyncope, or syncope.  He continues to exercise on an almost daily basis.  Echo on 05/08/2018 showed an EF of 50 to 55%, mild LVH, normal LV diastolic function, mildly dilated right ventricle with mild wall thickness and mildly to moderately reduced RV systolic function, bioprosthetic pulmonic valve was present with mild to moderate stenosis noted as well as mild to moderate regurgitation.  Case was discussed with his primary cardiologist who recommended repeat echocardiogram in 04/2019.  Patient was seen in the [facility] ED on 10/03/2018 with left-sided chest pressure.  Troponin negative x2.  Chest x-ray showed stable mild cardiomegaly with no  acute cardiopulmonary disease.  EKG showed right bundle branch block which was known for the patient.  Outpatient follow-up was recommended.  ***  Past Medical History:  Diagnosis Date  . Congenital heart disease    Dr. Earma Reading    Past Surgical History:  Procedure Laterality Date  . PULMONARY VALVE REPLACEMENT  2013   Dr. Phineas Inches, [facility]    No outpatient medications have been marked as taking for the 10/09/18 encounter (Appointment) with Sondra Barges, PA-C.    Allergies:   Patient has no known allergies.   Social History:  The patient  reports that he has never smoked. He has never used smokeless tobacco. He reports that he does not drink alcohol or use drugs.   Family History:  The patient's family history includes Hypertension in his maternal grandmother and mother.  ROS:   ROS   PHYSICAL EXAM: *** VS:  There were no vitals taken for this visit. BMI: There is no height or weight on file to calculate BMI.  Physical Exam   EKG:  Was ordered and interpreted by me today. Shows ***  Recent Labs: 10/03/2018: BUN 12; Creatinine, Ser 0.93; Hemoglobin 15.3; Platelets 255; Potassium 3.7; Sodium 139  No results found for requested labs within last 8760 hours.   Estimated Creatinine Clearance: 96 mL/min (by C-G formula based on SCr of 0.93 mg/dL).   Wt Readings from Last 3 Encounters:  10/03/18 130 lb (59 kg)  05/01/18 148 lb (67.1 kg)  11/06/16 135 lb (61.2 kg)  Other studies reviewed: Additional studies/records reviewed today include: summarized above  ASSESSMENT AND PLAN:  1. ***  Disposition: F/u with Dr. Kirke CorinArida or APP in ***  Current medicines are reviewed at length with the patient today.  The patient did not have any concerns regarding medicines.  Signed, Eula Listenyan Shamond Skelton, PA-C 10/08/2018 7:49 AM     [facility] [address] [place], [place] [address] [phone]

## 2018-10-09 ENCOUNTER — Ambulatory Visit: Payer: BLUE CROSS/BLUE SHIELD | Admitting: Physician Assistant

## 2018-10-16 ENCOUNTER — Encounter: Payer: Self-pay | Admitting: Nurse Practitioner

## 2018-10-16 ENCOUNTER — Ambulatory Visit (INDEPENDENT_AMBULATORY_CARE_PROVIDER_SITE_OTHER): Payer: BLUE CROSS/BLUE SHIELD | Admitting: Nurse Practitioner

## 2018-10-16 VITALS — BP 112/70 | HR 65 | Ht 66.0 in | Wt 153.5 lb

## 2018-10-16 DIAGNOSIS — Z953 Presence of xenogenic heart valve: Secondary | ICD-10-CM

## 2018-10-16 DIAGNOSIS — I370 Nonrheumatic pulmonary valve stenosis: Secondary | ICD-10-CM

## 2018-10-16 DIAGNOSIS — Q249 Congenital malformation of heart, unspecified: Secondary | ICD-10-CM | POA: Diagnosis not present

## 2018-10-16 DIAGNOSIS — I371 Nonrheumatic pulmonary valve insufficiency: Secondary | ICD-10-CM | POA: Diagnosis not present

## 2018-10-16 DIAGNOSIS — R0600 Dyspnea, unspecified: Secondary | ICD-10-CM

## 2018-10-16 NOTE — Patient Instructions (Signed)
Medication Instructions:  Your physician recommends that you continue on your current medications as directed. Please refer to the Current Medication list given to you today.  If you need a refill on your cardiac medications before your next appointment, please call your pharmacy.   Lab work: None ordered  If you have labs (blood work) drawn today and your tests are completely normal, you will receive your results only by: Marland Kitchen MyChart Message (if you have MyChart) OR . A paper copy in the mail If you have any lab test that is abnormal or we need to change your treatment, we will call you to review the results.  Testing/Procedures: Echo- previously scheduled for 05/10/19  Follow-Up: At [facility], you and your health needs are our priority.  As part of our continuing mission to provide you with exceptional heart care, we have created designated Provider Care Teams.  These Care Teams include your primary Cardiologist (physician) and Advanced Practice Providers (APPs -  Physician Assistants and Nurse Practitioners) who all work together to provide you with the care you need, when you need it. You will need a follow up appointment after Echo is completed.

## 2018-10-16 NOTE — Progress Notes (Signed)
Office Visit    Patient Name: William Pham Date of Encounter: 10/16/2018  Primary Care Provider:  Patient, No Pcp Per Primary Cardiologist:  Lorine Bears, MD  Chief Complaint    33 year old male with a history of tetralogy of fallot status post repair as an infant (10 months old), with subsequent bioprosthetic pulmonary valve replacement in 2013 who presents for follow-up related to congenital heart disease.  Past Medical History    Past Medical History:  Diagnosis Date  . Anxiety   . Pulmonary valve insufficiency    a. 10/2011 s/ SJM Trifecta bioprosthetic valve replacement ([facility]); b. 2016 Echo: Mild to mod PS; c. 04/2018 Echo: EF 50-55%, no rwma, mildly dil RV w/ mild to mod reduced RV fxn. 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 Peak gradient (S) 89mmHg].  . RBBB   . Tetralogy of Fallot    a. s/p repair @ age 52 months [facility]).   Past Surgical History:  Procedure Laterality Date  . PULMONARY VALVE REPLACEMENT  2013   Dr. Phineas Inches, [facility]  . TETRALOGY OF FALLOT REPAIR     a. 10 months old - [facility]    Allergies  Not on File  History of Present Illness    33 year old male with a prior history of tetralogy of fallot status post repair at 30 months old with subsequent bioprosthetic pulmonary valve replacement 2013, anxiety, and right bundle branch block.  He was last seen in cardiology clinic in August 2019, at which time he was doing well.  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  He was in his usual state of health until the morning of January 15, when he awoke feeling mildly dyspneic.  He says he was not huffing and puffing but noted that he could not get a satisfying breath.  He said he felt like he was running but was not necessarily experiencing palpitations.  He did not check his blood pressure or heart rate  at home.  After about 30 minutes, symptoms resolved but because of his history of congenital heart disease, his wife advised that he present to the emergency department.  There, troponins were normal and ECG showed stable right bundle branch block without acute changes.  Other labs were unremarkable and he was subsequently discharged and advised to follow-up with cardiology.  Since that ER visit, he has had no recurrence of symptoms.  He walks a couple times a week with his wife and has not noted any change in exercise tolerance.  At no point has he had chest pain or palpitations and further denies any PND, orthopnea, dizziness, syncope, edema, or early satiety.  Home Medications    Prior to Admission medications   Medication Sig Start Date End Date Taking? Authorizing Provider  aspirin 81 MG tablet Take 81 mg by mouth daily.    [provider]    Review of Systems    Mild dyspnea on January 15 which persisted about 30 minutes and resolve spontaneously.  He says he felt like he was running during the episode but was not necessarily huffing and puffing.  He denies chest pain, palpitations, PND, orthopnea, dizziness, syncope, edema, or early satiety.  All other systems reviewed and are otherwise negative except as noted above.  Physical Exam    VS:  BP 112/70 (BP Location: Left Arm, Patient Position: Sitting, Cuff Size:  Normal)   Pulse 65   Ht 5\' 6"  (1.676 m)   Wt 153 lb 8 oz (69.6 kg)   BMI 24.78 kg/m  , BMI Body mass index is 24.78 kg/m. GEN: Well nourished, well developed, in no acute distress. HEENT: normal. Neck: Supple, no JVD, carotid bruits, or masses. Cardiac: RRR, 2/6 systolic murmur at the left upper sternal border, no rubs, or gallops. No clubbing, cyanosis, edema.  Radials/DP/PT 2+ and equal bilaterally.  Respiratory:  Respirations regular and unlabored, clear to auscultation bilaterally. GI: Soft, nontender, nondistended, BS + x 4. MS: no deformity or atrophy. Skin:  warm and dry, no rash. Neuro:  Strength and sensation are intact. Psych: Normal affect.  Accessory Clinical Findings    ECG personally reviewed by me today -regular sinus rhythm, 65, right bundle branch block- no acute changes.  Assessment & Plan    1.  Dyspnea: Patient had abrupt onset of dyspnea on the morning of January 15, occurring shortly after awakening, lasting about 15 minutes, and resolving spontaneously.  He says he felt like was that he was running but denies noticing any increase in heart rate.  He did not have the ability to check his heart rate or blood pressure at home.  After symptoms resolved, he presented to the ED where work-up was unremarkable and troponins were normal despite prolonged symptoms.  He says at no point did he have chest pain that morning.  Since then, he has been walking and performing usual activities without any recurrence of symptoms.  We discussed options for evaluation to potentially include event monitoring as patients with his history are more prone to arrhythmias that might present as dyspnea.  We also discussed the role of stress testing.  Given lack of chest pain and no objective evidence of ischemia in the emergency department, we opted to forego stress testing at this time.  Further, as he has had no recurrent symptoms and because his wife fears that they would have to pay a lot out of pocket for monitoring, he prefers to hold off on event monitoring.  He understands that if he has any recurrent symptoms, we can always get these tests ordered.  I did show him an application for smart phone called heart rate plus, which will allow him to check his heart rate using his smart phone and is free.  2.  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  As above, he has not had any change in exercise tolerance and remains active.  3.   Disposition: Follow-up echo as planned in August with office follow-up after that.  Patient or wife will contact us sooner if he has any recurrence of dyspnea.  Nicolasa Ducking, NP 10/16/2018, 9:41 AM

## 2018-11-01 ENCOUNTER — Ambulatory Visit: Payer: BLUE CROSS/BLUE SHIELD | Admitting: Physician Assistant

## 2019-11-18 IMAGING — CR DG CHEST 2V
1 series · 2 of 2 positions shown · non-contrast
Comparison: CTA chest 11/06/2016. Chest x-rays 11/06/2016 and
earlier.

CLINICAL DATA: 32-year-old with personal history of tetralogy of
Fallot repair as an infant and pulmonary valve replacement. Acute
onset of LEFT-sided chest pain and pressure upon awakening this
morning. Nonsmoker.

EXAM:
CHEST - 2 VIEW

[Series 1: w chest pa · 0.14mm/px · 2 of 2 slices shown]
[im 1/2]
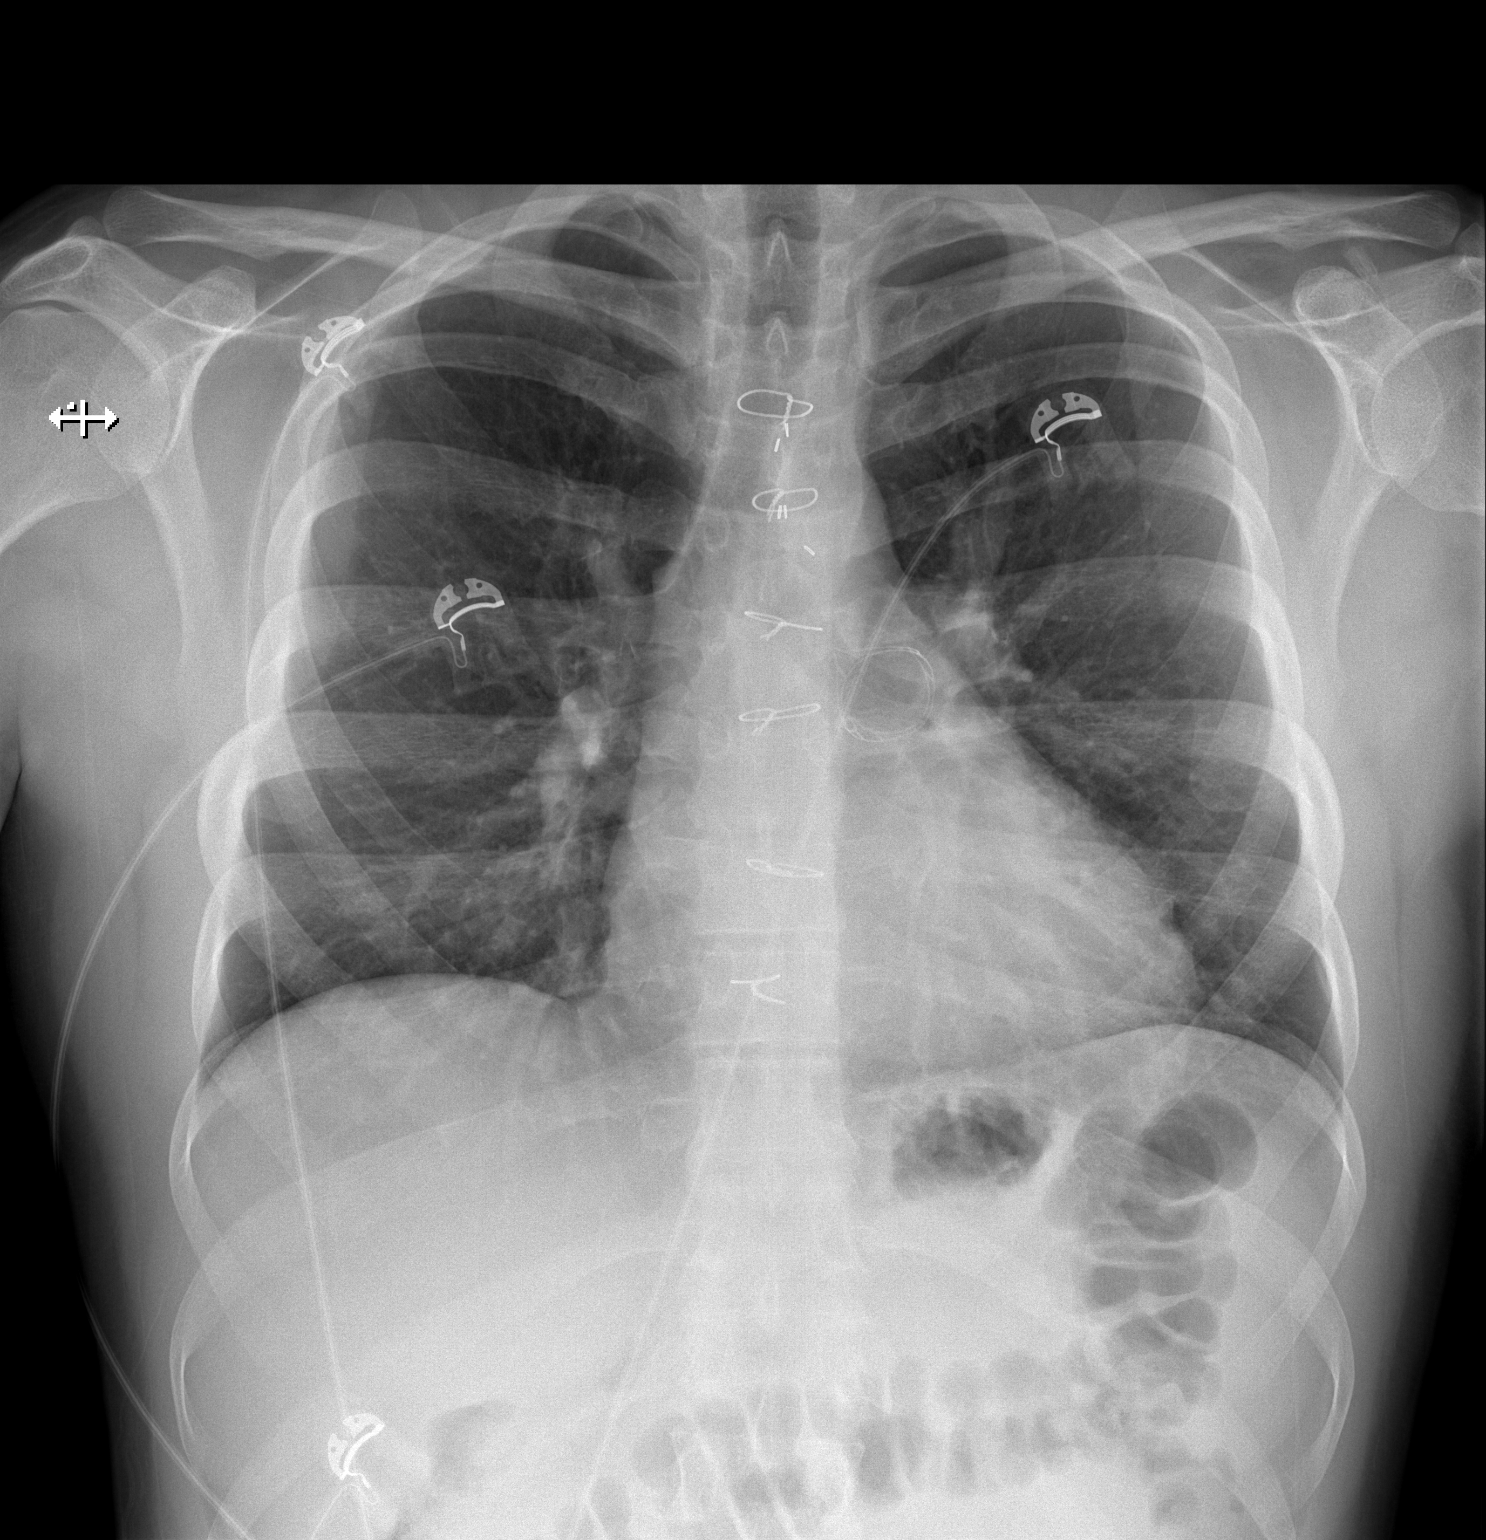
[im 2/2]
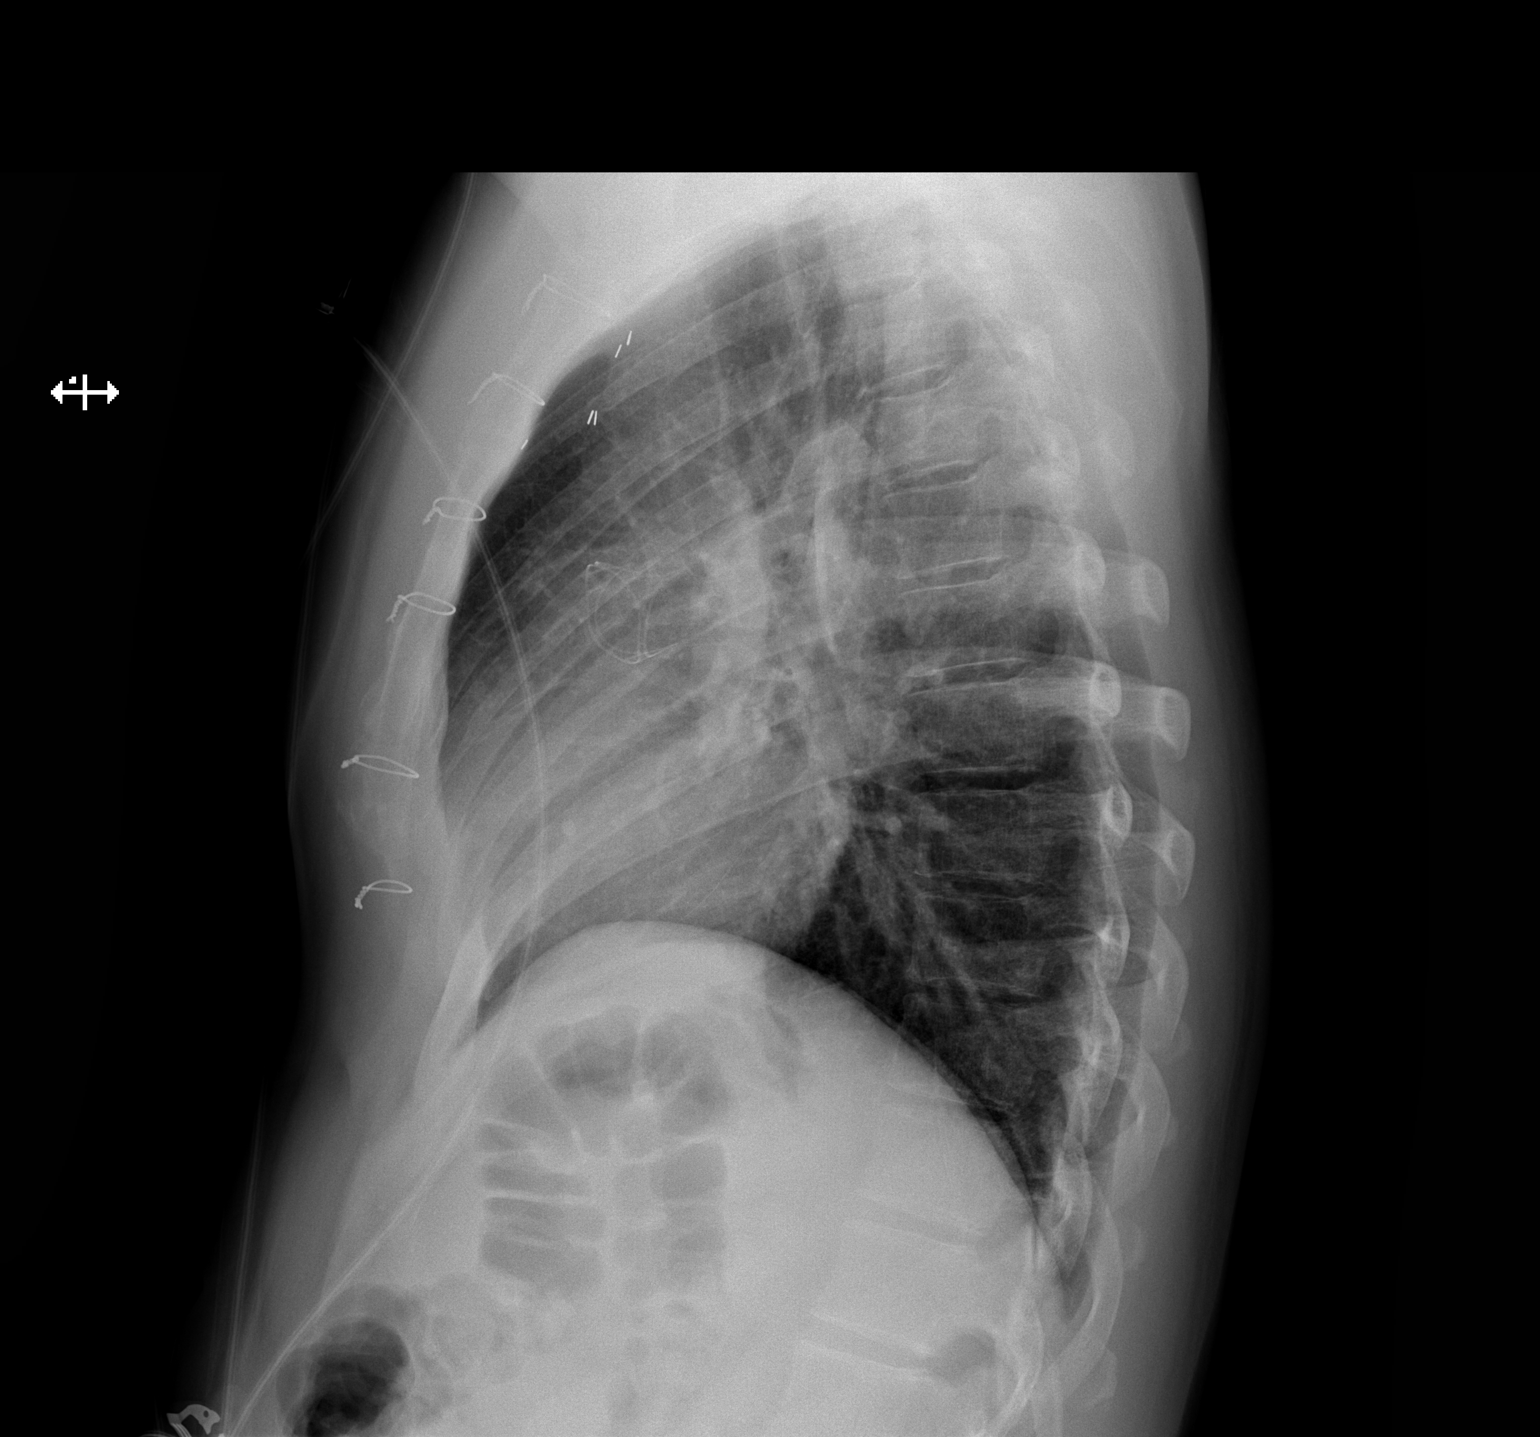

[2 of 2 positions shown; findings below may reference images not displayed]

FINDINGS: Cardiac silhouette mildly enlarged, unchanged. Pulmonary valve
replacement. Hilar and mediastinal contours otherwise unremarkable.
Lungs clear. Bronchovascular markings normal. Pulmonary vascularity
normal. No visible pleural effusions. No pneumothorax. Visualized
bony thorax intact. No interval change.
IMPRESSION: Stable mild cardiomegaly. No acute cardiopulmonary disease. Stable
examination.

## 2019-12-09 ENCOUNTER — Encounter: Payer: Self-pay | Admitting: Family

## 2019-12-09 ENCOUNTER — Ambulatory Visit (INDEPENDENT_AMBULATORY_CARE_PROVIDER_SITE_OTHER): Payer: BC Managed Care – PPO | Admitting: Family

## 2019-12-09 ENCOUNTER — Other Ambulatory Visit: Payer: Self-pay

## 2019-12-09 VITALS — BP 110/70 | HR 72 | Ht 66.0 in | Wt 149.1 lb

## 2019-12-09 DIAGNOSIS — Q249 Congenital malformation of heart, unspecified: Secondary | ICD-10-CM | POA: Diagnosis not present

## 2019-12-09 DIAGNOSIS — I370 Nonrheumatic pulmonary valve stenosis: Secondary | ICD-10-CM | POA: Diagnosis not present

## 2019-12-09 DIAGNOSIS — Z953 Presence of xenogenic heart valve: Secondary | ICD-10-CM | POA: Diagnosis not present

## 2019-12-09 DIAGNOSIS — I371 Nonrheumatic pulmonary valve insufficiency: Secondary | ICD-10-CM | POA: Diagnosis not present

## 2019-12-09 NOTE — Progress Notes (Signed)
Office Visit    Patient Name: ZACHORY MANGUAL Date of Encounter: 12/09/2019  Primary Care Provider:  Patient, No Pcp Per Primary Cardiologist:  Lorine Bears, MD Electrophysiologist:  None   Chief Complaint    William Pham is a 34 y.o. male with a hx of tetralogy of fallot s/p repair as an infant (33 months old) with subsequent bio-prosthestic pulmonary valve replacement in 2013 presents today for follow up of congenital heart disease.   Past Medical History    Past Medical History:  Diagnosis Date  . Anxiety   . Pulmonary valve insufficiency    a. 10/2011 s/ SJM Trifecta bioprosthetic valve replacement ([facility]); b. 2016 Echo: Mild to mod PS; c. 04/2018 Echo: EF 50-55%, no rwma, mildly dil RV w/ mild to mod reduced RV fxn. 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 Peak gradient (S) 61mmHg].  . RBBB   . Tetralogy of Fallot    a. s/p repair @ age 46 months [facility]).   Past Surgical History:  Procedure Laterality Date  . PULMONARY VALVE REPLACEMENT  2013   Dr. Phineas Inches, [facility]  . TETRALOGY OF FALLOT REPAIR     a. 10 months old - [facility]    Allergies  Not on File  History of Present Illness    Elridge Stemm Arismendez is a 34 y.o. male with a hx of tetralogy of fallot s/p repair as an infant (67 months old) with subsequent bioprosthestic pulmonary valve replacement in 2013, anxiety, RBBB last seen 10/16/18 by Ward Givens, NP.  Echo August 2019 LVEF 50-55%, RV mildly dilated, RV wall thickness mildly reduced and RV systolic function mildly to moderately reduced. Pulmonary valve prosthesis with mild to moderate stenosis and regurgitation. Recommended for repeat study in 1 year.   Seen in ED 10/03/18 for dyspnea. Labs were unremarkable and ECG showed stable RBBB. He was seen in the office 10/16/18 and reported no recurrent symptoms.   He reports feeling overall well.  Reports no chest pain, pressure, tightness.  Reports no recurrent dyspnea  nor shortness of breath.  He has been walking for exercise routinely.  Reports no lightheadedness, dizziness, near syncope.  Does not monitor his blood pressure at home. Endorses eating a heart healthy diet and trying to avoid salt.  Was unaware of recommendation for repeat echo in August and he is agreeable to have an echocardiogram performed.  EKGs/Labs/Other Studies Reviewed:   The following studies were reviewed today: Echo 04/2018 Left ventricle: The cavity size was normal. Wall thickness was    increased in a pattern of mild LVH. Systolic function was normal.    The estimated ejection fraction was in the range of 50% to 55%.    Left ventricular diastolic function parameters were normal.  - Right ventricle: The cavity size was mildly dilated. Wall    thickness was mildly increased. Systolic function was mildly to    moderately reduced.  - Pulmonic valve: A bioprosthesis was present. Transvalvular    velocity was increased. The findings are consistent with mild to    moderate stenosis. There was mild to moderate regurgitation.    Degree of regurgitation may be underestimated due to artifact    from the prosthetic valve. Peak velocity (S): 297 cm/s. Peak    gradient (S): 35 mm Hg.   EKG:  EKG is ordered today.  The ekg ordered today demonstrates RBBB.  Recent Labs: No results found for requested labs within last 8760 hours.  Recent Lipid Panel No results found for: CHOL, TRIG, HDL, CHOLHDL, VLDL, LDLCALC, LDLDIRECT  Home Medications   Current Meds  Medication Sig  . aspirin 81 MG tablet Take 81 mg by mouth daily.    Review of Systems      Review of Systems  Constitution: Negative for chills, fever and malaise/fatigue.  Cardiovascular: Negative for chest pain, dyspnea on exertion, irregular heartbeat, leg swelling, near-syncope, orthopnea, palpitations and syncope.  Respiratory: Negative for cough, shortness of breath and wheezing.   Gastrointestinal: Negative for melena,  nausea and vomiting.  Genitourinary: Negative for hematuria.  Neurological: Negative for dizziness, light-headedness and weakness.   All other systems reviewed and are otherwise negative except as noted above.  Physical Exam    VS:  BP 110/70 (BP Location: Left Arm, Patient Position: Sitting, Cuff Size: Normal)   Pulse 72   Ht 5\' 6"  (1.676 m)   Wt 149 lb 2 oz (67.6 kg)   SpO2 98%   BMI 24.07 kg/m  , BMI Body mass index is 24.07 kg/m. GEN: Well nourished, well developed, in no acute distress. HEENT: normal. Neck: Supple, no JVD, carotid bruits, or masses. Cardiac: RRR, no rubs, or gallops. Gr 2/6 systolic murmur at LUSB. No clubbing, cyanosis, edema.  Radials/DP/PT 2+ and equal bilaterally.  Respiratory:  Respirations regular and unlabored, clear to auscultation bilaterally. GI: Soft, nontender, nondistended, BS + x 4. MS: No deformity or atrophy. Skin: Warm and dry, no rash. Neuro:  Strength and sensation are intact. Psych: Normal affect.  Assessment & Plan    1. Congenital heart disease/Tetralogy of Fallot/bioprosthetic PV with pulmonic stenosis and regurgitation -Echo 04/2018 with mild to moderate pulmonic stenosis and regurgitation of bioprosthetic pulmonic valve.  Notes no changes in exercise tolerance, remains active, no dyspnea.  Obtain updated echocardiogram for monitoring of bioprosthetic pulmonic valve.  2. Dyspnea -No recurrent since episode January 2020.  No indication for ischemic eval nor ZIO monitoring at this time.  Of note, I did offer to collect the patient's labs as he has not had routine labs in greater than 1 year.  He politely declines.  Encouraged him to establish with PCP.  Disposition: Echocardiogram for monitoring of pulmonic valve replacement and previous tetralogy of Fallot repair.  Follow up in 1 year(s) with Dr. Fletcher Anon or APP   Loel Dubonnet, NP 12/09/2019, 1:38 PM

## 2019-12-09 NOTE — Patient Instructions (Signed)
Medication Instructions:  No medications today.  *If you need a refill on your cardiac medications before your next appointment, please call your pharmacy*   Lab Work: No lab work today.   If you have labs (blood work) drawn today and your tests are completely normal, you will receive your results only by: Marland Kitchen MyChart Message (if you have MyChart) OR . A paper copy in the mail If you have any lab test that is abnormal or we need to change your treatment, we will call you to review the results.  Testing/Procedures: Your EKG today showed normal sinus rhythm with a right bundle branch block. This is a stable finding.   Your physician has requested that you have an echocardiogram. Echocardiography is a painless test that uses sound waves to create images of your heart. It provides your doctor with information about the size and shape of your heart and how well your heart's chambers and valves are working. This procedure takes approximately one hour. There are no restrictions for this procedure.   Follow-Up: At [facility], you and your health needs are our priority.  As part of our continuing mission to provide you with exceptional heart care, we have created designated Provider Care Teams.  These Care Teams include your primary Cardiologist (physician) and Advanced Practice Providers (APPs -  Physician Assistants and Nurse Practitioners) who all work together to provide you with the care you need, when you need it.  We recommend signing up for the patient portal called "MyChart".  Sign up information is provided on this After Visit Summary.  MyChart is used to connect with patients for Virtual Visits (Telemedicine).  Patients are able to view lab/test results, encounter notes, upcoming appointments, etc.  Non-urgent messages can be sent to your provider as well.   To learn more about what you can do with MyChart, go to [email or web address].    Your next appointment:   1 year(s)  The  format for your next appointment:   In Person  Provider:    You may see Lorine Bears, MD or one of the following Advanced Practice Providers on your designated Care Team:    Nicolasa Ducking, NP  Eula Listen, PA-C  Marisue Ivan, PA-C  Other Instructions   Keep up the good work walking!   Heart-Healthy Eating Plan Heart-healthy meal planning includes:  Eating less unhealthy fats.  Eating more healthy fats.  Making other changes in your diet. Talk with your doctor or a diet specialist (dietitian) to create an eating plan that is right for you.  What are tips for following this plan? Cooking Avoid frying your food. Try to bake, boil, grill, or broil it instead. You can also reduce fat by:  Removing the skin from poultry.  Removing all visible fats from meats.  Steaming vegetables in water or broth. Meal planning   At meals, divide your plate into four equal parts: ? Fill one-half of your plate with vegetables and green salads. ? Fill one-fourth of your plate with whole grains. ? Fill one-fourth of your plate with lean protein foods.  Eat 4-5 servings of vegetables per day. A serving of vegetables is: ? 1 cup of raw or cooked vegetables. ? 2 cups of raw leafy greens.  Eat 4-5 servings of fruit per day. A serving of fruit is: ? 1 medium whole fruit. ?  cup of dried fruit. ?  cup of fresh, frozen, or canned fruit. ?  cup of 100% fruit juice.  Eat more  foods that have soluble fiber. These are apples, broccoli, carrots, beans, peas, and barley. Try to get 20-30 g of fiber per day.  Eat 4-5 servings of nuts, legumes, and seeds per week: ? 1 serving of dried beans or legumes equals  cup after being cooked. ? 1 serving of nuts is  cup. ? 1 serving of seeds equals 1 tablespoon. General information  Eat more home-cooked food. Eat less restaurant, buffet, and fast food.  Limit or avoid alcohol.  Limit foods that are high in starch and sugar.  Avoid  fried foods.  Lose weight if you are overweight.  Keep track of how much salt (sodium) you eat. This is important if you have high blood pressure. Ask your doctor to tell you more about this.  Try to add vegetarian meals each week. Fats  Choose healthy fats. These include olive oil and canola oil, flaxseeds, walnuts, almonds, and seeds.  Eat more omega-3 fats. These include salmon, mackerel, sardines, tuna, flaxseed oil, and ground flaxseeds. Try to eat fish at least 2 times each week.  Check food labels. Avoid foods with trans fats or high amounts of saturated fat.  Limit saturated fats. ? These are often found in animal products, such as meats, butter, and cream. ? These are also found in plant foods, such as palm oil, palm kernel oil, and coconut oil.  Avoid foods with partially hydrogenated oils in them. These have trans fats. Examples are stick margarine, some tub margarines, cookies, crackers, and other baked goods. What foods can I eat? Fruits All fresh, canned (in natural juice), or frozen fruits. Vegetables Fresh or frozen vegetables (raw, steamed, roasted, or grilled). Green salads. Grains Most grains. Choose whole wheat and whole grains most of the time. Rice and pasta, including brown rice and pastas made with whole wheat. Meats and other proteins Lean, well-trimmed beef, veal, pork, and lamb. Chicken and [place] without skin. All fish and shellfish. Wild duck, rabbit, pheasant, and venison. Egg whites or low-cholesterol egg substitutes. Dried beans, peas, lentils, and tofu. Seeds and most nuts. Dairy Low-fat or nonfat cheeses, including ricotta and mozzarella. Skim or 1% milk that is liquid, powdered, or evaporated. Buttermilk that is made with low-fat milk. Nonfat or low-fat yogurt. Fats and oils Non-hydrogenated (trans-free) margarines. Vegetable oils, including soybean, sesame, sunflower, olive, peanut, safflower, corn, canola, and cottonseed. Salad dressings or  mayonnaise made with a vegetable oil. Beverages Mineral water. Coffee and tea. Diet carbonated beverages. Sweets and desserts Sherbet, gelatin, and fruit ice. Small amounts of dark chocolate. Limit all sweets and desserts. Seasonings and condiments All seasonings and condiments. The items listed above may not be a complete list of foods and drinks you can eat. Contact a dietitian for more options. What foods should I avoid? Fruits Canned fruit in heavy syrup. Fruit in cream or butter sauce. Fried fruit. Limit coconut. Vegetables Vegetables cooked in cheese, cream, or butter sauce. Fried vegetables. Grains Breads that are made with saturated or trans fats, oils, or whole milk. Croissants. Sweet rolls. Donuts. High-fat crackers, such as cheese crackers. Meats and other proteins Fatty meats, such as hot dogs, ribs, sausage, bacon, rib-eye roast or steak. High-fat deli meats, such as salami and bologna. Caviar. Domestic duck and goose. Organ meats, such as liver. Dairy Cream, sour cream, cream cheese, and creamed cottage cheese. Whole-milk cheeses. Whole or 2% milk that is liquid, evaporated, or condensed. Whole buttermilk. Cream sauce or high-fat cheese sauce. Yogurt that is made from whole milk. Fats  and oils Meat fat, or shortening. Cocoa butter, hydrogenated oils, palm oil, coconut oil, palm kernel oil. Solid fats and shortenings, including bacon fat, salt pork, lard, and butter. Nondairy cream substitutes. Salad dressings with cheese or sour cream. Beverages Regular sodas and juice drinks with added sugar. Sweets and desserts Frosting. Pudding. Cookies. Cakes. Pies. Milk chocolate or white chocolate. Buttered syrups. Full-fat ice cream or ice cream drinks. The items listed above may not be a complete list of foods and drinks to avoid. Contact a dietitian for more information. Summary  Heart-healthy meal planning includes eating less unhealthy fats, eating more healthy fats, and making  other changes in your diet.  Eat a balanced diet. This includes fruits and vegetables, low-fat or nonfat dairy, lean protein, nuts and legumes, whole grains, and heart-healthy oils and fats. This information is not intended to replace advice given to you by your health care provider. Make sure you discuss any questions you have with your health care provider. Document Revised: 11/09/2017 Document Reviewed: 10/13/2017 Elsevier Patient Education  2020 [employer].

## 2020-01-21 ENCOUNTER — Other Ambulatory Visit: Payer: Self-pay

## 2020-01-21 ENCOUNTER — Ambulatory Visit (INDEPENDENT_AMBULATORY_CARE_PROVIDER_SITE_OTHER): Payer: BC Managed Care – PPO

## 2020-01-21 DIAGNOSIS — Q249 Congenital malformation of heart, unspecified: Secondary | ICD-10-CM | POA: Diagnosis not present

## 2020-01-21 DIAGNOSIS — I370 Nonrheumatic pulmonary valve stenosis: Secondary | ICD-10-CM | POA: Diagnosis not present

## 2020-01-22 ENCOUNTER — Telehealth: Payer: Self-pay

## 2020-01-22 NOTE — Telephone Encounter (Signed)
Attempted to call patient. LMTCB 01/22/2020   

## 2020-01-22 NOTE — Telephone Encounter (Signed)
-----   Message from Alver Sorrow, NP sent at 01/22/2020  4:31 PM EDT ----- Normal heart pumping function (was previously low normal, now mildly improved). Right ventricle shows continued moderately reduced function (previously mild-moderately reduced). Pulmonic valve overall stable still with moderate leakiness and mild stiffness. Overall stable finding. Would recommend repeat echocardiogram in 1 year for monitoring of right ventricle function.

## 2020-01-27 NOTE — Telephone Encounter (Signed)
Attempted to call patient. LMTCB 01/27/2020   

## 2020-01-29 NOTE — Telephone Encounter (Signed)
Attempted to call patient. LMTCB 01/29/2020  3rd attempt. Letter sent.   Encounter completed at this time.

## 2023-02-10 ENCOUNTER — Ambulatory Visit: Payer: BC Managed Care – PPO | Attending: Cardiovascular Disease | Admitting: Cardiovascular Disease

## 2023-02-10 ENCOUNTER — Encounter: Payer: Self-pay | Admitting: Cardiovascular Disease

## 2023-02-10 VITALS — BP 120/72 | HR 76 | Ht 64.0 in | Wt 167.4 lb

## 2023-02-10 DIAGNOSIS — Z8774 Personal history of (corrected) congenital malformations of heart and circulatory system: Secondary | ICD-10-CM | POA: Diagnosis not present

## 2023-02-10 DIAGNOSIS — I379 Nonrheumatic pulmonary valve disorder, unspecified: Secondary | ICD-10-CM

## 2023-02-10 NOTE — Patient Instructions (Signed)
Medication Instructions:  No changes *If you need a refill on your cardiac medications before your next appointment, please call your pharmacy*   Lab Work: None ordered If you have labs (blood work) drawn today and your tests are completely normal, you will receive your results only by: MyChart Message (if you have MyChart) OR A paper copy in the mail If you have any lab test that is abnormal or we need to change your treatment, we will call you to review the results.   Testing/Procedures: Your physician has requested that you have an echocardiogram. Echocardiography is a painless test that uses sound waves to create images of your heart. It provides your doctor with information about the size and shape of your heart and how well your heart's chambers and valves are working.   You may receive an ultrasound enhancing agent through an IV if needed to better visualize your heart during the echo. This procedure takes approximately one hour.  There are no restrictions for this procedure.  This will take place at [address] ([facility] [address], [place] [address]    Follow-Up: At [facility], you and your health needs are our priority.  As part of our continuing mission to provide you with exceptional heart care, we have created designated Provider Care Teams.  These Care Teams include your primary Cardiologist (physician) and Advanced Practice Providers (APPs -  Physician Assistants and Nurse Practitioners) who all work together to provide you with the care you need, when you need it.  We recommend signing up for the patient portal called "MyChart".  Sign up information is provided on this After Visit Summary.  MyChart is used to connect with patients for Virtual Visits (Telemedicine).  Patients are able to view lab/test results, encounter notes, upcoming appointments, etc.  Non-urgent messages can be sent to your provider as well.   To learn more about what you can  do with MyChart, go to [email or web address].    Your next appointment:   12 month(s)  Provider:   You may see Muhammad Arida, MD or one of the following Advanced Practice Providers on your designated Care Team:   Christopher Berge, NP Ryan Dunn, PA-C Cadence Furth, PA-C Sheri Hammock, NP    

## 2023-02-10 NOTE — Progress Notes (Signed)
Cardiology Office Note   Date:  02/10/2023   ID:  William Pham, DOB 25-Jun-1986, MRN [id number]  PCP:  Patient, No Pcp Per  Cardiologist:   Lorine Bears, MD   Chief Complaint  Patient presents with   New Patient (Initial Visit)    Re-est. care LS by Glenetta Borg. Meds reviewed verbally with pt.      History of Present Illness: William Pham is a 37 y.o. male who presents for afollow-up visit. 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  He has been doing very well and reports no chest pain, shortness of breath or palpitations. 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   Past Medical History:  Diagnosis Date   Anxiety    Pulmonary valve insufficiency    a. 10/2011 s/ SJM Trifecta bioprosthetic valve replacement ([facility]); b. 2016 Echo: Mild to mod PS; c. 04/2018 Echo: EF 50-55%, no rwma, mildly dil RV w/ mild to mod reduced RV fxn. 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 Peak gradient (S) 48mmHg].   RBBB    Tetralogy of Fallot    a. s/p repair @ age 38 months [facility]).    Past Surgical History:  Procedure Laterality Date   PULMONARY VALVE REPLACEMENT  2013   Dr. Phineas Inches, [facility]   TETRALOGY OF FALLOT REPAIR     a. 58 months old - [facility]     Current Outpatient Medications  Medication Sig Dispense Refill   aspirin 81 MG tablet Take 81 mg by mouth daily.     No current facility-administered medications for this visit.    Allergies:   Patient has no allergy information on record.    Social History:  The patient  reports that he has never  smoked. He has never used smokeless tobacco. He reports that he does not drink alcohol and does not use drugs.   Family History:  The patient's family history includes Hypertension in his maternal grandmother and mother.    ROS:  Please see the history of present illness.   Otherwise, review of systems are positive for none.   All other systems are reviewed and negative.    PHYSICAL EXAM: VS:  BP 120/72 (BP Location: Right Arm, Patient Position: Sitting, Cuff Size: Normal)   Pulse 76   Ht 5\' 4"  (1.626 m)   Wt 167 lb 6 oz (75.9 kg)   SpO2 98%   BMI 28.73 kg/m  , BMI Body mass index is 28.73 kg/m. GEN: Well nourished, well developed, in no acute distress  HEENT: normal  Neck: no JVD, carotid bruits, or masses Cardiac: RRR; no  rubs,no edema . There is 2/6 crescendo decrescendo systolic murmur in the pulmonic area with slightly increased S2.  There is a right-sided S3. Respiratory:  clear to auscultation bilaterally, normal work of breathing GI: soft, nontender, nondistended, + BS MS: no deformity or atrophy  Skin: warm and dry, no rash Neuro:  Strength and sensation are intact Psych: euthymic mood, full affect   EKG:  EKG is ordered today. The ekg ordered today demonstrates normal sinus rhythm with right bundle branch  block.   Recent Labs: No results found for requested labs within last 365 days.    Lipid Panel No results found for: "CHOL", "TRIG", "HDL", "CHOLHDL", "VLDL", "LDLCALC", "LDLDIRECT"    Wt Readings from Last 3 Encounters:  02/10/23 167 lb 6 oz (75.9 kg)  12/09/19 149 lb 2 oz (67.6 kg)  10/16/18 153 lb 8 oz (69.6 kg)          02/10/2023    3:50 PM  PAD Screen  Previous PAD dx? No  Previous surgical procedure? No  Pain with walking? No  Feet/toe relief with dangling? No  Painful, non-healing ulcers? No  Extremities discolored? No      ASSESSMENT AND PLAN:  1.  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 Continue observation.  2. 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  In addition, he has moderately reduced RV systolic function.  By exam today he has a right-sided S3 but no JVD.  I requested a follow-up echocardiogram.   He is to continue antibiotic prophylaxis before any dental procedures.   Disposition:   FU with me in 1 year  Signed,  Lorine Bears, MD  02/10/2023 3:57 PM    [facility]

## 2023-04-05 ENCOUNTER — Ambulatory Visit: Payer: BC Managed Care – PPO | Attending: Cardiovascular Disease

## 2023-04-05 DIAGNOSIS — I379 Nonrheumatic pulmonary valve disorder, unspecified: Secondary | ICD-10-CM | POA: Diagnosis not present

## 2023-04-05 LAB — ECHOCARDIOGRAM COMPLETE
AR max vel: 3.07 cm2
AV Area VTI: 2.96 cm2
AV Area mean vel: 2.98 cm2
AV Mean grad: 2.5 mmHg
AV Peak grad: 4.4 mmHg
Ao pk vel: 1.05 m/s
Area-P 1/2: 4.53 cm2
S' Lateral: 3.1 cm
Single Plane A4C EF: 49.9 %

## 2023-07-04 ENCOUNTER — Telehealth: Payer: Self-pay | Admitting: Cardiovascular Disease

## 2023-07-04 NOTE — Telephone Encounter (Signed)
   Primary Cardiologist: Lorine Bears, MD  Chart reviewed as part of pre-operative protocol coverage. Simple dental extractions (1-2 teeth) and dental cleanings are considered low risk procedures per guidelines and generally do not require any specific cardiac clearance. It is also generally accepted that for simple extractions and dental cleanings, there is no need to interrupt blood thinner therapy.   SBE prophylaxis is required for the patient.  I will route this recommendation to the requesting party via Epic fax function and remove from pre-op pool.  Please call with questions.  Levi Aland, NP-C  07/04/2023, 1:33 PM [address], [address] Office [phone] Fax [phone]

## 2023-07-04 NOTE — Telephone Encounter (Signed)
   Pre-operative Risk Assessment    Patient Name: William Pham  DOB: 02-12-1986 MRN: [id number]     Request for Surgical Clearance    Procedure: Possible  PossibeDental Extraction - Amount of Teeth to be Pulled:  1  Date of Surgery:                                  Surgeon:  Dr Orson Eva Surgeon's Group or Practice Name:   Phone number:  [phone] Fax number:  [phone]   Type of Clearance Requested:   Medical and medicine if he has any- wwants to know if he will have any type of contraindications    Type of Anesthesia:  Local    Additional requests/questions:    Signed, Laurence Ferrari   07/04/2023, 1:12 PM

## 2024-11-13 ENCOUNTER — Ambulatory Visit: Admitting: Cardiovascular Disease
